# Patient Record
Sex: Male | Born: 1985
Health system: Southern US, Community
[De-identification: ages and names within clinical notes are randomized; demographics above are authoritative.]

## PROBLEM LIST (undated history)

## (undated) DIAGNOSIS — G43909 Migraine, unspecified, not intractable, without status migrainosus: Secondary | ICD-10-CM

## (undated) DIAGNOSIS — J690 Pneumonitis due to inhalation of food and vomit: Secondary | ICD-10-CM

## (undated) DIAGNOSIS — K5792 Diverticulitis of intestine, part unspecified, without perforation or abscess without bleeding: Secondary | ICD-10-CM

## (undated) HISTORY — DX: Diverticulitis of intestine, part unspecified, without perforation or abscess without bleeding: K57.92

## (undated) HISTORY — PX: COLON SURGERY: SHX602

## (undated) HISTORY — PX: WISDOM TOOTH EXTRACTION: SHX21

## (undated) HISTORY — PX: LUNG SURGERY: SHX703

---

## 2006-05-26 ENCOUNTER — Ambulatory Visit: Payer: Self-pay | Admitting: Internal Medicine

## 2006-09-08 ENCOUNTER — Ambulatory Visit: Payer: Self-pay | Admitting: Internal Medicine

## 2006-09-08 LAB — CONVERTED CEMR LAB
ALT: 25 units/L (ref 0–40)
AST: 26 units/L (ref 0–37)
Albumin: 5.2 g/dL (ref 3.5–5.2)
Alkaline Phosphatase: 71 units/L (ref 39–117)
Bilirubin, Direct: 0.2 mg/dL (ref 0.0–0.3)
Chol/HDL Ratio, serum: 4.2
Cholesterol: 154 mg/dL (ref 0–200)
HDL: 36.6 mg/dL — ABNORMAL LOW (ref >39.0)
LDL Cholesterol: 100 mg/dL — ABNORMAL HIGH (ref 0–99)
Total Bilirubin: 1.8 mg/dL — ABNORMAL HIGH (ref 0.3–1.2)
Total Protein: 7.8 g/dL (ref 6.0–8.3)
Triglyceride fasting, serum: 86 mg/dL (ref 0–149)
VLDL: 17 mg/dL (ref 0–40)

## 2019-02-15 DIAGNOSIS — K631 Perforation of intestine (nontraumatic): Secondary | ICD-10-CM

## 2019-02-15 HISTORY — DX: Perforation of intestine (nontraumatic): K63.1

## 2019-03-05 ENCOUNTER — Emergency Department (HOSPITAL_COMMUNITY): Payer: BLUE CROSS/BLUE SHIELD | Admitting: Certified Registered"

## 2019-03-05 ENCOUNTER — Inpatient Hospital Stay (HOSPITAL_BASED_OUTPATIENT_CLINIC_OR_DEPARTMENT_OTHER)
Admission: EM | Admit: 2019-03-05 | Discharge: 2019-03-13 | DRG: 329 | Disposition: A | Discharge status: Home / Self Care | Payer: BLUE CROSS/BLUE SHIELD | Attending: Emergency Medicine | Admitting: Emergency Medicine

## 2019-03-05 ENCOUNTER — Encounter (HOSPITAL_COMMUNITY): Admission: EM | Disposition: A | Discharge status: Home / Self Care | Payer: Self-pay | Source: Home / Self Care

## 2019-03-05 ENCOUNTER — Emergency Department (HOSPITAL_BASED_OUTPATIENT_CLINIC_OR_DEPARTMENT_OTHER): Payer: BLUE CROSS/BLUE SHIELD

## 2019-03-05 ENCOUNTER — Encounter (HOSPITAL_BASED_OUTPATIENT_CLINIC_OR_DEPARTMENT_OTHER): Payer: Self-pay | Admitting: Emergency Medicine

## 2019-03-05 ENCOUNTER — Other Ambulatory Visit: Payer: Self-pay

## 2019-03-05 DIAGNOSIS — K631 Perforation of intestine (nontraumatic): Secondary | ICD-10-CM | POA: Diagnosis present

## 2019-03-05 DIAGNOSIS — K651 Peritoneal abscess: Secondary | ICD-10-CM | POA: Diagnosis present

## 2019-03-05 DIAGNOSIS — E876 Hypokalemia: Secondary | ICD-10-CM | POA: Diagnosis not present

## 2019-03-05 DIAGNOSIS — R1031 Right lower quadrant pain: Secondary | ICD-10-CM | POA: Diagnosis not present

## 2019-03-05 DIAGNOSIS — R509 Fever, unspecified: Secondary | ICD-10-CM | POA: Diagnosis not present

## 2019-03-05 DIAGNOSIS — K5720 Diverticulitis of large intestine with perforation and abscess without bleeding: Secondary | ICD-10-CM | POA: Diagnosis not present

## 2019-03-05 DIAGNOSIS — K567 Ileus, unspecified: Secondary | ICD-10-CM | POA: Diagnosis not present

## 2019-03-05 DIAGNOSIS — A419 Sepsis, unspecified organism: Secondary | ICD-10-CM | POA: Diagnosis not present

## 2019-03-05 DIAGNOSIS — R109 Unspecified abdominal pain: Secondary | ICD-10-CM | POA: Diagnosis not present

## 2019-03-05 DIAGNOSIS — N179 Acute kidney failure, unspecified: Secondary | ICD-10-CM | POA: Diagnosis present

## 2019-03-05 DIAGNOSIS — K6389 Other specified diseases of intestine: Secondary | ICD-10-CM | POA: Diagnosis not present

## 2019-03-05 DIAGNOSIS — R197 Diarrhea, unspecified: Secondary | ICD-10-CM | POA: Diagnosis not present

## 2019-03-05 HISTORY — PX: LAPAROTOMY: SHX154

## 2019-03-05 LAB — CBC WITH DIFFERENTIAL/PLATELET
Abs Immature Granulocytes: 0.08 10*3/uL — ABNORMAL HIGH (ref 0.00–0.07)
Basophils Absolute: 0 10*3/uL (ref 0.0–0.1)
Basophils Relative: 0 %
Eosinophils Absolute: 0 10*3/uL (ref 0.0–0.5)
Eosinophils Relative: 0 %
HCT: 36.5 % — ABNORMAL LOW (ref 39.0–52.0)
Hemoglobin: 11.3 g/dL — ABNORMAL LOW (ref 13.0–17.0)
Immature Granulocytes: 1 %
Lymphocytes Relative: 7 %
Lymphs Abs: 1.1 10*3/uL (ref 0.7–4.0)
MCH: 26.7 pg (ref 26.0–34.0)
MCHC: 31 g/dL (ref 30.0–36.0)
MCV: 86.3 fL (ref 80.0–100.0)
Monocytes Absolute: 1 10*3/uL (ref 0.1–1.0)
Monocytes Relative: 6 %
Neutro Abs: 13.5 10*3/uL — ABNORMAL HIGH (ref 1.7–7.7)
Neutrophils Relative %: 86 %
Platelets: 481 10*3/uL — ABNORMAL HIGH (ref 150–400)
RBC: 4.23 MIL/uL (ref 4.22–5.81)
RDW: 14 % (ref 11.5–15.5)
WBC: 15.7 10*3/uL — ABNORMAL HIGH (ref 4.0–10.5)
nRBC: 0 % (ref 0.0–0.2)

## 2019-03-05 LAB — URINALYSIS, ROUTINE W REFLEX MICROSCOPIC
Bilirubin Urine: NEGATIVE
Glucose, UA: NEGATIVE mg/dL
Hgb urine dipstick: NEGATIVE
Ketones, ur: 15 mg/dL — AB
Leukocytes,Ua: NEGATIVE
Nitrite: NEGATIVE
Protein, ur: NEGATIVE mg/dL
Specific Gravity, Urine: 1.025 (ref 1.005–1.030)
pH: 8.5 — ABNORMAL HIGH (ref 5.0–8.0)

## 2019-03-05 LAB — COMPREHENSIVE METABOLIC PANEL
ALT: 10 U/L (ref 0–44)
AST: 11 U/L — ABNORMAL LOW (ref 15–41)
Albumin: 2.4 g/dL — ABNORMAL LOW (ref 3.5–5.0)
Alkaline Phosphatase: 49 U/L (ref 38–126)
Anion gap: 7 (ref 5–15)
BUN: 9 mg/dL (ref 6–20)
CO2: 15 mmol/L — ABNORMAL LOW (ref 22–32)
Calcium: 7 mg/dL — ABNORMAL LOW (ref 8.9–10.3)
Chloride: 115 mmol/L — ABNORMAL HIGH (ref 98–111)
Creatinine, Ser: 0.62 mg/dL (ref 0.61–1.24)
GFR calc Af Amer: >60 mL/min (ref >60)
GFR calc non Af Amer: >60 mL/min (ref >60)
Glucose, Bld: 90 mg/dL (ref 70–99)
Potassium: 2.9 mmol/L — ABNORMAL LOW (ref 3.5–5.1)
Sodium: 137 mmol/L (ref 135–145)
Total Bilirubin: 0.7 mg/dL (ref 0.3–1.2)
Total Protein: 5.6 g/dL — ABNORMAL LOW (ref 6.5–8.1)

## 2019-03-05 LAB — LIPASE, BLOOD: Lipase: 17 U/L (ref 11–51)

## 2019-03-05 LAB — LACTIC ACID, PLASMA
Lactic Acid, Venous: 2.3 mmol/L (ref 0.5–1.9)
Lactic Acid, Venous: 1.2 mmol/L (ref 0.5–1.9)

## 2019-03-05 SURGERY — LAPAROTOMY, EXPLORATORY
Anesthesia: General

## 2019-03-05 MED ORDER — METRONIDAZOLE IN NACL 5-0.79 MG/ML-% IV SOLN
500.0000 mg | Freq: Once | INTRAVENOUS | Status: AC
  Administered 2019-03-05: 17:00:00 500 mg via INTRAVENOUS
  Filled 2019-03-05: qty 100

## 2019-03-05 MED ORDER — OXYCODONE HCL 5 MG PO TABS: 5.0000 mg | ORAL_TABLET | Freq: Once | ORAL | Status: DC | PRN

## 2019-03-05 MED ORDER — PHENYLEPHRINE 40 MCG/ML (10ML) SYRINGE FOR IV PUSH (FOR BLOOD PRESSURE SUPPORT)
PREFILLED_SYRINGE | INTRAVENOUS | Status: AC
  Filled 2019-03-05: qty 10

## 2019-03-05 MED ORDER — PROPOFOL 10 MG/ML IV BOLUS
INTRAVENOUS | Status: DC | PRN
  Administered 2019-03-05: 160 mg via INTRAVENOUS

## 2019-03-05 MED ORDER — LACTATED RINGERS IV SOLN
INTRAVENOUS | Status: DC | PRN
  Administered 2019-03-05 (×3): via INTRAVENOUS

## 2019-03-05 MED ORDER — PANTOPRAZOLE SODIUM 40 MG IV SOLR
40.0000 mg | Freq: Every day | INTRAVENOUS | Status: DC
  Administered 2019-03-06 – 2019-03-10 (×6): 40 mg via INTRAVENOUS
  Filled 2019-03-05 (×6): qty 40

## 2019-03-05 MED ORDER — DIPHENHYDRAMINE HCL 50 MG/ML IJ SOLN: 12.5000 mg | Freq: Four times a day (QID) | INTRAMUSCULAR | Status: DC | PRN

## 2019-03-05 MED ORDER — ONDANSETRON 4 MG PO TBDP: 4.0000 mg | ORAL_TABLET | Freq: Four times a day (QID) | ORAL | Status: DC | PRN

## 2019-03-05 MED ORDER — METRONIDAZOLE IN NACL 5-0.79 MG/ML-% IV SOLN
500.0000 mg | Freq: Three times a day (TID) | INTRAVENOUS | Status: DC
  Administered 2019-03-06 – 2019-03-09 (×11): 500 mg via INTRAVENOUS
  Filled 2019-03-05 (×9): qty 100

## 2019-03-05 MED ORDER — DEXAMETHASONE SODIUM PHOSPHATE 10 MG/ML IJ SOLN
INTRAMUSCULAR | Status: DC | PRN
  Administered 2019-03-05: 10 mg via INTRAVENOUS

## 2019-03-05 MED ORDER — OXYCODONE HCL 5 MG/5ML PO SOLN: 5.0000 mg | Freq: Once | ORAL | Status: DC | PRN

## 2019-03-05 MED ORDER — LIDOCAINE 2% (20 MG/ML) 5 ML SYRINGE
INTRAMUSCULAR | Status: DC | PRN
  Administered 2019-03-05: 60 mg via INTRAVENOUS

## 2019-03-05 MED ORDER — SODIUM CHLORIDE 0.9 % IV BOLUS
1000.0000 mL | Freq: Once | INTRAVENOUS | Status: AC
  Administered 2019-03-05: 1000 mL via INTRAVENOUS

## 2019-03-05 MED ORDER — PHENYLEPHRINE HCL (PRESSORS) 10 MG/ML IV SOLN
INTRAVENOUS | Status: DC | PRN
  Administered 2019-03-05 (×3): 80 ug via INTRAVENOUS

## 2019-03-05 MED ORDER — LIDOCAINE 2% (20 MG/ML) 5 ML SYRINGE
INTRAMUSCULAR | Status: AC
  Filled 2019-03-05: qty 5

## 2019-03-05 MED ORDER — HYDROMORPHONE HCL 1 MG/ML IJ SOLN
INTRAMUSCULAR | Status: AC
  Filled 2019-03-05: qty 0.5

## 2019-03-05 MED ORDER — SODIUM CHLORIDE 0.9 % IV BOLUS
500.0000 mL | Freq: Once | INTRAVENOUS | Status: AC
  Administered 2019-03-05: 500 mL via INTRAVENOUS

## 2019-03-05 MED ORDER — ONDANSETRON HCL 4 MG/2ML IJ SOLN
INTRAMUSCULAR | Status: AC
  Filled 2019-03-05: qty 2

## 2019-03-05 MED ORDER — NALOXONE HCL 0.4 MG/ML IJ SOLN: 0.4000 mg | INTRAMUSCULAR | Status: DC | PRN

## 2019-03-05 MED ORDER — ONDANSETRON HCL 4 MG/2ML IJ SOLN: 4.0000 mg | Freq: Four times a day (QID) | INTRAMUSCULAR | Status: DC | PRN

## 2019-03-05 MED ORDER — METHOCARBAMOL 500 MG PO TABS
500.0000 mg | ORAL_TABLET | Freq: Four times a day (QID) | ORAL | Status: DC | PRN
  Administered 2019-03-06 – 2019-03-09 (×3): 500 mg via ORAL
  Filled 2019-03-05 (×3): qty 1

## 2019-03-05 MED ORDER — ONDANSETRON HCL 4 MG/2ML IJ SOLN
INTRAMUSCULAR | Status: DC | PRN
  Administered 2019-03-05: 4 mg via INTRAVENOUS

## 2019-03-05 MED ORDER — KETOROLAC TROMETHAMINE 30 MG/ML IJ SOLN
30.0000 mg | Freq: Four times a day (QID) | INTRAMUSCULAR | Status: AC
  Administered 2019-03-05 – 2019-03-10 (×20): 30 mg via INTRAVENOUS
  Filled 2019-03-05 (×16): qty 1

## 2019-03-05 MED ORDER — DEXAMETHASONE SODIUM PHOSPHATE 10 MG/ML IJ SOLN
INTRAMUSCULAR | Status: AC
  Filled 2019-03-05: qty 1

## 2019-03-05 MED ORDER — KETOROLAC TROMETHAMINE 30 MG/ML IJ SOLN
30.0000 mg | Freq: Four times a day (QID) | INTRAMUSCULAR | Status: DC | PRN
  Filled 2019-03-05: qty 1

## 2019-03-05 MED ORDER — SODIUM CHLORIDE 0.9 % IV SOLN
2.0000 g | Freq: Once | INTRAVENOUS | Status: AC
  Administered 2019-03-05: 2 g via INTRAVENOUS
  Filled 2019-03-05: qty 20

## 2019-03-05 MED ORDER — CHLORHEXIDINE GLUCONATE CLOTH 2 % EX PADS: 6.0000 | MEDICATED_PAD | Freq: Once | CUTANEOUS | Status: DC

## 2019-03-05 MED ORDER — PROPOFOL 10 MG/ML IV BOLUS
INTRAVENOUS | Status: AC
  Filled 2019-03-05: qty 20

## 2019-03-05 MED ORDER — FENTANYL CITRATE (PF) 250 MCG/5ML IJ SOLN
INTRAMUSCULAR | Status: AC
  Filled 2019-03-05: qty 5

## 2019-03-05 MED ORDER — KETOROLAC TROMETHAMINE 30 MG/ML IJ SOLN
INTRAMUSCULAR | Status: AC
  Administered 2019-03-05: 23:00:00 30 mg via INTRAVENOUS
  Filled 2019-03-05: qty 1

## 2019-03-05 MED ORDER — SODIUM CHLORIDE 0.9 % IV SOLN
2.0000 g | INTRAVENOUS | Status: DC
  Administered 2019-03-06 – 2019-03-08 (×3): 2 g via INTRAVENOUS
  Filled 2019-03-05 (×4): qty 20

## 2019-03-05 MED ORDER — ONDANSETRON HCL 4 MG/2ML IJ SOLN
4.0000 mg | Freq: Four times a day (QID) | INTRAMUSCULAR | Status: DC | PRN
  Administered 2019-03-07 – 2019-03-08 (×4): 4 mg via INTRAVENOUS
  Filled 2019-03-05 (×4): qty 2

## 2019-03-05 MED ORDER — ACETAMINOPHEN 10 MG/ML IV SOLN: 1000.0000 mg | Freq: Once | INTRAVENOUS | Status: DC | PRN

## 2019-03-05 MED ORDER — SODIUM CHLORIDE 0.9% FLUSH: 9.0000 mL | INTRAVENOUS | Status: DC | PRN

## 2019-03-05 MED ORDER — SUCCINYLCHOLINE CHLORIDE 200 MG/10ML IV SOSY
PREFILLED_SYRINGE | INTRAVENOUS | Status: AC
  Filled 2019-03-05: qty 10

## 2019-03-05 MED ORDER — SODIUM CHLORIDE 0.9 % IV SOLN
INTRAVENOUS | Status: DC | PRN
  Administered 2019-03-05: 250 mL via INTRAVENOUS

## 2019-03-05 MED ORDER — ACETAMINOPHEN 10 MG/ML IV SOLN
INTRAVENOUS | Status: AC
  Filled 2019-03-05: qty 100

## 2019-03-05 MED ORDER — SODIUM CHLORIDE 0.9 % IV SOLN: 2.0000 g | INTRAVENOUS | Status: DC

## 2019-03-05 MED ORDER — ACETAMINOPHEN 650 MG RE SUPP: 650.0000 mg | Freq: Four times a day (QID) | RECTAL | Status: DC | PRN

## 2019-03-05 MED ORDER — POTASSIUM CHLORIDE CRYS ER 20 MEQ PO TBCR
60.0000 meq | EXTENDED_RELEASE_TABLET | Freq: Once | ORAL | Status: AC
  Administered 2019-03-05: 60 meq via ORAL
  Filled 2019-03-05: qty 3

## 2019-03-05 MED ORDER — ROCURONIUM BROMIDE 50 MG/5ML IV SOSY
PREFILLED_SYRINGE | INTRAVENOUS | Status: DC | PRN
  Administered 2019-03-05: 30 mg via INTRAVENOUS
  Administered 2019-03-05: 50 mg via INTRAVENOUS

## 2019-03-05 MED ORDER — FENTANYL CITRATE (PF) 100 MCG/2ML IJ SOLN
50.0000 ug | INTRAMUSCULAR | Status: DC | PRN
  Administered 2019-03-06 (×2): 50 ug via INTRAVENOUS
  Filled 2019-03-05 (×3): qty 2

## 2019-03-05 MED ORDER — HYDRALAZINE HCL 20 MG/ML IJ SOLN: 10.0000 mg | INTRAMUSCULAR | Status: DC | PRN

## 2019-03-05 MED ORDER — LIDOCAINE HCL (CARDIAC) PF 100 MG/5ML IV SOSY: PREFILLED_SYRINGE | INTRAVENOUS | Status: DC | PRN

## 2019-03-05 MED ORDER — SUCCINYLCHOLINE CHLORIDE 20 MG/ML IJ SOLN
INTRAMUSCULAR | Status: DC | PRN
  Administered 2019-03-05: 70 mg via INTRAVENOUS

## 2019-03-05 MED ORDER — ACETAMINOPHEN 325 MG PO TABS
650.0000 mg | ORAL_TABLET | Freq: Once | ORAL | Status: AC
  Administered 2019-03-05: 17:00:00 650 mg via ORAL
  Filled 2019-03-05: qty 2

## 2019-03-05 MED ORDER — ROCURONIUM BROMIDE 50 MG/5ML IV SOSY
PREFILLED_SYRINGE | INTRAVENOUS | Status: AC
  Filled 2019-03-05: qty 10

## 2019-03-05 MED ORDER — FENTANYL 40 MCG/ML IV SOLN: INTRAVENOUS | Status: DC

## 2019-03-05 MED ORDER — SUGAMMADEX SODIUM 200 MG/2ML IV SOLN
INTRAVENOUS | Status: DC | PRN
  Administered 2019-03-05: 200 mg via INTRAVENOUS

## 2019-03-05 MED ORDER — ACETAMINOPHEN 10 MG/ML IV SOLN
INTRAVENOUS | Status: DC | PRN
  Administered 2019-03-05: 1000 mg via INTRAVENOUS

## 2019-03-05 MED ORDER — IOHEXOL 300 MG/ML  SOLN
100.0000 mL | Freq: Once | INTRAMUSCULAR | Status: AC | PRN
  Administered 2019-03-05: 100 mL via INTRAVENOUS

## 2019-03-05 MED ORDER — DIPHENHYDRAMINE HCL 12.5 MG/5ML PO ELIX: 12.5000 mg | ORAL_SOLUTION | Freq: Four times a day (QID) | ORAL | Status: DC | PRN

## 2019-03-05 MED ORDER — HYDROMORPHONE HCL 1 MG/ML IJ SOLN
INTRAMUSCULAR | Status: DC | PRN
  Administered 2019-03-05 (×2): .5 mg via INTRAVENOUS

## 2019-03-05 MED ORDER — 0.9 % SODIUM CHLORIDE (POUR BTL) OPTIME
TOPICAL | Status: DC | PRN
  Administered 2019-03-05 (×4): 1000 mL

## 2019-03-05 MED ORDER — SODIUM CHLORIDE 0.9 % IV SOLN
INTRAVENOUS | Status: DC | PRN
  Administered 2019-03-05: 17:00:00 250 mL via INTRAVENOUS

## 2019-03-05 MED ORDER — METRONIDAZOLE IN NACL 5-0.79 MG/ML-% IV SOLN: 500.0000 mg | Freq: Three times a day (TID) | INTRAVENOUS | Status: DC

## 2019-03-05 MED ORDER — MIDAZOLAM HCL 2 MG/2ML IJ SOLN
INTRAMUSCULAR | Status: AC
  Filled 2019-03-05: qty 2

## 2019-03-05 MED ORDER — DEXTROSE-NACL 5-0.9 % IV SOLN
INTRAVENOUS | Status: DC
  Administered 2019-03-05 – 2019-03-12 (×11): via INTRAVENOUS

## 2019-03-05 MED ORDER — ENOXAPARIN SODIUM 40 MG/0.4ML ~~LOC~~ SOLN
40.0000 mg | SUBCUTANEOUS | Status: DC
  Administered 2019-03-06 – 2019-03-13 (×8): 40 mg via SUBCUTANEOUS
  Filled 2019-03-05 (×8): qty 0.4

## 2019-03-05 MED ORDER — ENOXAPARIN SODIUM 40 MG/0.4ML ~~LOC~~ SOLN: 40.0000 mg | SUBCUTANEOUS | Status: DC

## 2019-03-05 MED ORDER — ACETAMINOPHEN 160 MG/5ML PO SOLN: 1000.0000 mg | Freq: Once | ORAL | Status: DC | PRN

## 2019-03-05 MED ORDER — FENTANYL CITRATE (PF) 100 MCG/2ML IJ SOLN
INTRAMUSCULAR | Status: DC | PRN
  Administered 2019-03-05 (×2): 50 ug via INTRAVENOUS
  Administered 2019-03-05: 150 ug via INTRAVENOUS

## 2019-03-05 MED ORDER — ACETAMINOPHEN 325 MG PO TABS
650.0000 mg | ORAL_TABLET | Freq: Four times a day (QID) | ORAL | Status: DC | PRN
  Administered 2019-03-06: 650 mg via ORAL
  Filled 2019-03-05: qty 2

## 2019-03-05 MED ORDER — FENTANYL CITRATE (PF) 100 MCG/2ML IJ SOLN
25.0000 ug | INTRAMUSCULAR | Status: DC | PRN
  Administered 2019-03-05 (×2): 50 ug via INTRAVENOUS

## 2019-03-05 MED ORDER — MIDAZOLAM HCL 5 MG/5ML IJ SOLN
INTRAMUSCULAR | Status: DC | PRN
  Administered 2019-03-05: 2 mg via INTRAVENOUS

## 2019-03-05 MED ORDER — ACETAMINOPHEN 500 MG PO TABS: 1000.0000 mg | ORAL_TABLET | Freq: Once | ORAL | Status: DC | PRN

## 2019-03-05 MED ORDER — FENTANYL CITRATE (PF) 100 MCG/2ML IJ SOLN
INTRAMUSCULAR | Status: AC
  Administered 2019-03-05: 23:00:00 50 ug via INTRAVENOUS
  Filled 2019-03-05: qty 2

## 2019-03-05 SURGICAL SUPPLY — 58 items
BLADE CLIPPER SURG (BLADE) IMPLANT
BNDG GAUZE ELAST 4 BULKY (GAUZE/BANDAGES/DRESSINGS) ×3 IMPLANT
CANISTER SUCT 3000ML PPV (MISCELLANEOUS) ×3 IMPLANT
CATH FOLEY 2WAY SLVR  5CC 16FR (CATHETERS) ×2
CATH FOLEY 2WAY SLVR 5CC 16FR (CATHETERS) ×1 IMPLANT
CHLORAPREP W/TINT 26ML (MISCELLANEOUS) ×3 IMPLANT
COVER SURGICAL LIGHT HANDLE (MISCELLANEOUS) ×3 IMPLANT
COVER WAND RF STERILE (DRAPES) ×3 IMPLANT
DRAIN CHANNEL 19F RND (DRAIN) ×3 IMPLANT
DRAPE LAPAROSCOPIC ABDOMINAL (DRAPES) ×3 IMPLANT
DRAPE WARM FLUID 44X44 (DRAPE) ×3 IMPLANT
DRSG OPSITE POSTOP 4X10 (GAUZE/BANDAGES/DRESSINGS) IMPLANT
DRSG OPSITE POSTOP 4X8 (GAUZE/BANDAGES/DRESSINGS) IMPLANT
DRSG PAD ABDOMINAL 8X10 ST (GAUZE/BANDAGES/DRESSINGS) ×3 IMPLANT
ELECT BLADE 6.5 EXT (BLADE) IMPLANT
ELECT CAUTERY BLADE 6.4 (BLADE) ×3 IMPLANT
ELECT REM PT RETURN 9FT ADLT (ELECTROSURGICAL) ×3
ELECTRODE REM PT RTRN 9FT ADLT (ELECTROSURGICAL) ×1 IMPLANT
GAUZE SPONGE 4X4 12PLY STRL (GAUZE/BANDAGES/DRESSINGS) ×3 IMPLANT
GLOVE BIO SURGEON STRL SZ 6.5 (GLOVE) ×2 IMPLANT
GLOVE BIO SURGEON STRL SZ8 (GLOVE) ×3 IMPLANT
GLOVE BIO SURGEONS STRL SZ 6.5 (GLOVE) ×1
GLOVE BIOGEL PI IND STRL 7.0 (GLOVE) ×2 IMPLANT
GLOVE BIOGEL PI IND STRL 8 (GLOVE) ×1 IMPLANT
GLOVE BIOGEL PI INDICATOR 7.0 (GLOVE) ×4
GLOVE BIOGEL PI INDICATOR 8 (GLOVE) ×2
GLOVE SURG SS PI 7.0 STRL IVOR (GLOVE) ×6 IMPLANT
GOWN STRL REUS W/ TWL LRG LVL3 (GOWN DISPOSABLE) ×1 IMPLANT
GOWN STRL REUS W/ TWL XL LVL3 (GOWN DISPOSABLE) ×1 IMPLANT
GOWN STRL REUS W/TWL LRG LVL3 (GOWN DISPOSABLE) ×2
GOWN STRL REUS W/TWL XL LVL3 (GOWN DISPOSABLE) ×2
KIT BASIN OR (CUSTOM PROCEDURE TRAY) ×3 IMPLANT
KIT OSTOMY DRAINABLE 2.75 STR (WOUND CARE) ×3 IMPLANT
KIT TURNOVER KIT B (KITS) ×3 IMPLANT
LIGASURE IMPACT 36 18CM CVD LR (INSTRUMENTS) ×3 IMPLANT
NS IRRIG 1000ML POUR BTL (IV SOLUTION) ×6 IMPLANT
PACK GENERAL/GYN (CUSTOM PROCEDURE TRAY) ×3 IMPLANT
PAD ARMBOARD 7.5X6 YLW CONV (MISCELLANEOUS) ×3 IMPLANT
PENCIL SMOKE EVACUATOR (MISCELLANEOUS) ×3 IMPLANT
PUNCH CORNEAL DONOR 7.0MM (MISCELLANEOUS) ×3 IMPLANT
RELOAD PROXIMATE 75MM BLUE (ENDOMECHANICALS) ×6 IMPLANT
SPECIMEN JAR LARGE (MISCELLANEOUS) IMPLANT
SPONGE LAP 18X18 RF (DISPOSABLE) ×3 IMPLANT
STAPLER PROXIMATE 75MM BLUE (STAPLE) ×3 IMPLANT
STAPLER VISISTAT 35W (STAPLE) ×3 IMPLANT
SUCTION POOLE TIP (SUCTIONS) ×3 IMPLANT
SUT ETHILON 2 0 FS 18 (SUTURE) ×3 IMPLANT
SUT PDS AB 1 TP1 96 (SUTURE) ×6 IMPLANT
SUT PROLENE 2 0 CT2 30 (SUTURE) ×3 IMPLANT
SUT VIC AB 2-0 SH 18 (SUTURE) ×6 IMPLANT
SUT VIC AB 3-0 SH 18 (SUTURE) ×3 IMPLANT
SUT VIC AB 3-0 SH 8-18 (SUTURE) ×3 IMPLANT
SUT VICRYL AB 2 0 TIES (SUTURE) ×3 IMPLANT
SUT VICRYL AB 3 0 TIES (SUTURE) ×3 IMPLANT
TOWEL OR 17X24 6PK STRL BLUE (TOWEL DISPOSABLE) ×3 IMPLANT
TOWEL OR 17X26 10 PK STRL BLUE (TOWEL DISPOSABLE) ×3 IMPLANT
TRAY FOLEY MTR SLVR 16FR STAT (SET/KITS/TRAYS/PACK) ×3 IMPLANT
YANKAUER SUCT BULB TIP NO VENT (SUCTIONS) IMPLANT

## 2019-03-05 NOTE — ED Notes (Signed)
Patient transported to CT 

## 2019-03-05 NOTE — ED Notes (Signed)
ED Provider at bedside. 

## 2019-03-05 NOTE — H&P (Signed)
Kyle EhlersBryceton Moss is an 33 y.o. male.   Chief Complaint: Abdominal pain x2 weeks HPI: Patient transferred from the [facility] emergency room secondary to abdominal pain.  He presents to the emergency room this morning with progressive lower abdominal pain.  The pain service 2 weeks ago.  Is been associated with diarrhea.  There is been no blood in his stool.  He has no medical problems and takes no chronic medications.  CT scan showed perforation of his distal descending colon or sigmoid colon.  There is free air of through the umbilicus.  He was extremely tender in his lower abdomen.  He also had an elevated white count of 15,000 and a fever to 101.  He denies any recent history of cough, shortness of breath or other complicating issues.  History reviewed. No pertinent past medical history.  History reviewed. No pertinent surgical history.  History reviewed. No pertinent family history. Social History:  reports that he has never smoked. He has never used smokeless tobacco. He reports that he does not drink alcohol or use drugs.  Allergies: No Known Allergies  (Not in a hospital admission)   Results for orders placed or performed during the hospital encounter of 03/05/19 (from the past 48 hour(s))  CBC with Differential     Status: Abnormal   Collection Time: 03/05/19  2:31 PM  Result Value Ref Range   WBC 15.7 (H) 4.0 - 10.5 K/uL   RBC 4.23 4.22 - 5.81 MIL/uL   Hemoglobin 11.3 (L) 13.0 - 17.0 g/dL   HCT 16.136.5 (L) 09.639.0 - 04.552.0 %   MCV 86.3 80.0 - 100.0 fL   MCH 26.7 26.0 - 34.0 pg   MCHC 31.0 30.0 - 36.0 g/dL   RDW 40.914.0 81.111.5 - 91.415.5 %   Platelets 481 (H) 150 - 400 K/uL   nRBC 0.0 0.0 - 0.2 %   Neutrophils Relative % 86 %   Neutro Abs 13.5 (H) 1.7 - 7.7 K/uL   Lymphocytes Relative 7 %   Lymphs Abs 1.1 0.7 - 4.0 K/uL   Monocytes Relative 6 %   Monocytes Absolute 1.0 0.1 - 1.0 K/uL   Eosinophils Relative 0 %   Eosinophils Absolute 0.0 0.0 - 0.5 K/uL   Basophils Relative 0 %    Basophils Absolute 0.0 0.0 - 0.1 K/uL   Immature Granulocytes 1 %   Abs Immature Granulocytes 0.08 (H) 0.00 - 0.07 K/uL    Comment: Performed at [facility], [address] [place], [place] [id number]  Comprehensive metabolic panel     Status: Abnormal   Collection Time: 03/05/19  2:31 PM  Result Value Ref Range   Sodium 137 135 - 145 mmol/L   Potassium 2.9 (L) 3.5 - 5.1 mmol/L   Chloride 115 (H) 98 - 111 mmol/L   CO2 15 (L) 22 - 32 mmol/L   Glucose, Bld 90 70 - 99 mg/dL   BUN 9 6 - 20 mg/dL   Creatinine, Ser 6.210.62 0.61 - 1.24 mg/dL   Calcium 7.0 (L) 8.9 - 10.3 mg/dL   Total Protein 5.6 (L) 6.5 - 8.1 g/dL   Albumin 2.4 (L) 3.5 - 5.0 g/dL   AST 11 (L) 15 - 41 U/L   ALT 10 0 - 44 U/L   Alkaline Phosphatase 49 38 - 126 U/L   Total Bilirubin 0.7 0.3 - 1.2 mg/dL   GFR calc non Af Amer >60 >60 mL/min   GFR calc Af Amer >60 >60  mL/min   Anion gap 7 5 - 15    Comment: Performed at [facility], [address] [place] [address] [place], [place] [address]  Lipase, blood     Status: None   Collection Time: 03/05/19  2:31 PM  Result Value Ref Range   Lipase 17 11 - 51 U/L    Comment: Performed at [facility], [address] [place], [place] [address]  Urinalysis, Routine w reflex microscopic     Status: Abnormal   Collection Time: 03/05/19  2:31 PM  Result Value Ref Range   Color, Urine YELLOW YELLOW   APPearance CLEAR CLEAR   Specific Gravity, Urine 1.025 1.005 - 1.030   pH 8.5 (H) 5.0 - 8.0   Glucose, UA NEGATIVE NEGATIVE mg/dL   Hgb urine dipstick NEGATIVE NEGATIVE   Bilirubin Urine NEGATIVE NEGATIVE   Ketones, ur 15 (A) NEGATIVE mg/dL   Protein, ur NEGATIVE NEGATIVE mg/dL   Nitrite NEGATIVE NEGATIVE   Leukocytes,Ua NEGATIVE NEGATIVE    Comment: Microscopic not done on urines with negative protein, blood, leukocytes, nitrite, or glucose < 500 mg/dL. Performed at [facility], [address] [place], [place] [address]   Lactic acid, plasma      Status: Abnormal   Collection Time: 03/05/19  2:31 PM  Result Value Ref Range   Lactic Acid, Venous 2.3 (HH) 0.5 - 1.9 mmol/L    Comment: CRITICAL RESULT CALLED TO, READ BACK BY AND VERIFIED WITHBryna Colander RN AT 1537 03/05/19 BY [facility] Performed at [facility], [address] [place], [place] [address]   Lactic acid, plasma     Status: None   Collection Time: 03/05/19  4:53 PM  Result Value Ref Range   Lactic Acid, Venous 1.2 0.5 - 1.9 mmol/L    Comment: Performed at [facility], [address] [place], [place] [address]   Ct Abdomen Pelvis W Contrast  Result Date: 03/05/2019 CLINICAL DATA:  Abdominal pain EXAM: CT ABDOMEN AND PELVIS WITH CONTRAST TECHNIQUE: Multidetector CT imaging of the abdomen and pelvis was performed using the standard protocol following bolus administration of intravenous contrast. CONTRAST:  OMNIPAQUE IOHEXOL 300 MG/ML  SOLN COMPARISON:  None. FINDINGS: Lower chest: No acute abnormality. Hepatobiliary: No focal liver abnormality is seen. No gallstones, gallbladder wall thickening, or biliary dilatation. Pancreas: Unremarkable. No pancreatic ductal dilatation or surrounding inflammatory changes. Spleen: Normal in size without focal abnormality. Adrenals/Urinary Tract: Normal adrenal glands. Two hypodense, fluid attenuating left renal masses measuring 16 mm and 12 mm respectively consistent with cysts. No urolithiasis or obstructive uropathy. Generalized bladder wall thickening likely reactive secondary to adjacent inflammation from the contained perforation of sigmoid colon. Stomach/Bowel: Normal stomach. Normal appendix. Bowel wall thickening and surrounding inflammatory changes of the descending colon and sigmoid colon. 8.8 x 8 x 14 cm thick walled cavitary area along the sigmoid colon with an area of communication with the sigmoid colon on image 32/series 5 and appearing to decompress into the umbilicus. Cavitary area has a moderate amount  of air and stool material within it. Small rim enhancing area along the mesenteric aspect of the sigmoid colon on image 53/series 2 may reflect a diverticulum versus small mural abscess with similar areas seen slightly more proximally on image 55 and 56. Vascular/Lymphatic: No significant vascular findings are present. No enlarged abdominal or pelvic lymph nodes. Reproductive: Prostate is unremarkable. Other: No abdominal wall hernia or abnormality. No abdominopelvic ascites.  Musculoskeletal: No acute or significant osseous findings. IMPRESSION: 1. Bowel wall thickening and surrounding inflammatory changes of the descending colon and sigmoid colon. 8.8 x 8 x 14 cm contained perforation arising from the sigmoid colon which continues to communicate with the sigmoid colon best seen on image 32/series 5 and decompressing into the umbilicus. Small rim enhancing areas are also seen along the wall of the sigmoid colon on image 53, 55 and 56 of series 2 which may reflect small diverticula versus mural abscesses. Differential considerations for overall process include an inflammatory etiology such as Crohn's disease versus diverticulitis which is considered less likely as there are no other diverticula visualized elsewhere and given the patient's age. Electronically Signed   By: Elige Ko   On: 03/05/2019 16:57    Review of Systems  Constitutional: Negative for chills and fever.  Gastrointestinal: Positive for abdominal pain and diarrhea. Negative for blood in stool, constipation, nausea and vomiting.  Skin: Negative for rash.  All other systems reviewed and are negative.   Blood pressure 125/80, pulse (!) 105, temperature 98.2 F (36.8 C), temperature source Oral, resp. rate 18, height  (1.778 m), weight 64.9 kg, SpO2 100 %. Physical Exam  Constitutional: He is oriented to person, place, and time. He appears well-developed and well-nourished.  HENT:  Head: Normocephalic and atraumatic.  Eyes: Pupils  are equal, round, and reactive to light.  Neck: Normal range of motion. Neck supple.  Cardiovascular: Normal rate and regular rhythm.  Respiratory: Effort normal and breath sounds normal.  GI: There is abdominal tenderness in the right lower quadrant, suprapubic area and left lower quadrant. There is rebound and guarding. There is no rigidity.  Musculoskeletal: Normal range of motion.  Neurological: He is alert and oriented to person, place, and time.  Skin: Skin is warm and dry.     Assessment/Plan Colonic perforation with peritonitis  Unusual appearing CT scan.  This could be from Crohn's disease but he has no history of chronic abdominal pain the diarrhea is just recent.  Less likely diverticulitis but this is is a possibility.  This could be a sigmoid volvulus as well.  Given the amount of free air, inflammation, elevated white count, and fever I recommend exploratory laparotomy with resection.  He may require colostomy which she is aware of.  We discussed potential complications of surgery to include but not exclusive of bleeding, infection, organ injury, bowel injury, the need for multiple procedures, blood clot, stroke, death, colostomy, the future need for further procedures, and the need further treatments and/or procedures.  The other option would be medical management but given his fever and his tachycardia I do not feel this would be best at this point time after reviewing his CT scan and examining the patient.  Dortha Schwalbe, MD 03/05/2019, 7:37 PM

## 2019-03-05 NOTE — Transfer of Care (Signed)
Immediate Anesthesia Transfer of Care Note  Patient: Kyle Moss  Procedure(s) Performed: EXPLORATORY LAPAROTOMY (N/A )  Patient Location: PACU  Anesthesia Type:General  Level of Consciousness: awake, alert  and oriented  Airway & Oxygen Therapy: Patient Spontanous Breathing  Post-op Assessment: Report given to RN and Post -op Vital signs reviewed and stable  Post vital signs: Reviewed and stable  Last Vitals:  Vitals Value Taken Time  BP 131/85 03/05/2019 10:17 PM  Temp    Pulse 98 03/05/2019 10:19 PM  Resp 12 03/05/2019 10:19 PM  SpO2 100 % 03/05/2019 10:19 PM  Vitals shown include unvalidated device data.  Last Pain:  Vitals:   03/05/19 1933  TempSrc: Oral  PainSc:          Complications: No apparent anesthesia complications

## 2019-03-05 NOTE — ED Provider Notes (Signed)
[facility] EMERGENCY DEPARTMENT Provider Note   CSN: [id number] Arrival date & time: 03/05/19  1349    History   Chief Complaint Chief Complaint  Patient presents with   Abdominal Pain    HPI Kyle Moss is a 33 y.o. male.     33 year old male presents with complaint of abdominal discomfort and loose stools x2 weeks.  Patient reports periumbilical abdominal discomfort, loose stools anytime he eats anything with discomfort described as pressure on his lower bowels.  Patient denies nausea or vomiting, went to urgent care today for evaluation and was found to have a temperature of 101.4, was 100.2 on recheck and was sent to the ER for further evaluation.  Upon arrival in triage patient has a temperature of 99.9 oral with a pulse of 123.  Patient does unaware of his fever prior to evaluation with urgent care and has not had any antipyretics.  Patient denies any blood in his stool, recent travel or sick contacts.  Patient states that he had a respiratory illness in February which caused him to lose his appetite, states since that time he has lost about 20 pounds over 2 months, attributes this to his lack of appetite.  Patient denies any medical history including any history of IBS or Crohn's disease, reports possible history of IBS in the family.  No prior abdominal surgeries.  No other complaints or concerns.     History reviewed. No pertinent past medical history.  There are no active problems to display for this patient.   History reviewed. No pertinent surgical history.      Home Medications    Prior to Admission medications   Not on File    Family History History reviewed. No pertinent family history.  Social History Social History   Tobacco Use   Smoking status: Never Smoker   Smokeless tobacco: Never Used  Substance Use Topics   Alcohol use: Never    Frequency: Never   Drug use: Never     Allergies   Patient has no known  allergies.   Review of Systems Review of Systems  Constitutional: Positive for appetite change, fever and unexpected weight change. Negative for chills and diaphoresis.  Gastrointestinal: Positive for abdominal pain and diarrhea. Negative for anal bleeding, blood in stool, constipation, nausea and vomiting.  Genitourinary: Negative for dysuria, frequency and urgency.  Skin: Negative for rash and wound.  Allergic/Immunologic: Negative for immunocompromised state.  Neurological: Negative for dizziness and weakness.  Hematological: Negative for adenopathy.  All other systems reviewed and are negative.    Physical Exam Updated Vital Signs BP 108/64    Pulse (!) 112    Temp 98.6 F (37 C) (Oral)    Resp 20    Ht  (1.778 m)    Wt 64.9 kg    SpO2 97%    BMI 20.52 kg/m   Physical Exam Vitals signs and nursing note reviewed.  Constitutional:      General: He is not in acute distress.    Appearance: He is well-developed. He is not ill-appearing or diaphoretic.  HENT:     Head: Normocephalic and atraumatic.  Cardiovascular:     Rate and Rhythm: Regular rhythm. Tachycardia present.     Heart sounds: No murmur.  Pulmonary:     Effort: Pulmonary effort is normal.     Breath sounds: Normal breath sounds.  Abdominal:     General: Bowel sounds are normal.     Tenderness: There is generalized abdominal  tenderness. There is guarding. Small umbilical hernia with mild erythema.  Skin:    General: Skin is warm and dry.  Neurological:     Mental Status: He is alert and oriented to person, place, and time.  Psychiatric:        Behavior: Behavior normal.      ED Treatments / Results  Labs (all labs ordered are listed, but only abnormal results are displayed) Labs Reviewed  CBC WITH DIFFERENTIAL/PLATELET - Abnormal; Notable for the following components:      Result Value   WBC 15.7 (*)    Hemoglobin 11.3 (*)    HCT 36.5 (*)    Platelets 481 (*)    Neutro Abs 13.5 (*)    Abs  Immature Granulocytes 0.08 (*)    All other components within normal limits  COMPREHENSIVE METABOLIC PANEL - Abnormal; Notable for the following components:   Potassium 2.9 (*)    Chloride 115 (*)    CO2 15 (*)    Calcium 7.0 (*)    Total Protein 5.6 (*)    Albumin 2.4 (*)    AST 11 (*)    All other components within normal limits  URINALYSIS, ROUTINE W REFLEX MICROSCOPIC - Abnormal; Notable for the following components:   pH 8.5 (*)    Ketones, ur 15 (*)    All other components within normal limits  LACTIC ACID, PLASMA - Abnormal; Notable for the following components:   Lactic Acid, Venous 2.3 (*)    All other components within normal limits  CULTURE, BLOOD (ROUTINE X 2)  CULTURE, BLOOD (ROUTINE X 2)  LIPASE, BLOOD  LACTIC ACID, PLASMA    EKG None  Radiology Ct Abdomen Pelvis W Contrast  Result Date: 03/05/2019 CLINICAL DATA:  Abdominal pain EXAM: CT ABDOMEN AND PELVIS WITH CONTRAST TECHNIQUE: Multidetector CT imaging of the abdomen and pelvis was performed using the standard protocol following bolus administration of intravenous contrast. CONTRAST:  OMNIPAQUE IOHEXOL 300 MG/ML  SOLN COMPARISON:  None. FINDINGS: Lower chest: No acute abnormality. Hepatobiliary: No focal liver abnormality is seen. No gallstones, gallbladder wall thickening, or biliary dilatation. Pancreas: Unremarkable. No pancreatic ductal dilatation or surrounding inflammatory changes. Spleen: Normal in size without focal abnormality. Adrenals/Urinary Tract: Normal adrenal glands. Two hypodense, fluid attenuating left renal masses measuring 16 mm and 12 mm respectively consistent with cysts. No urolithiasis or obstructive uropathy. Generalized bladder wall thickening likely reactive secondary to adjacent inflammation from the contained perforation of sigmoid colon. Stomach/Bowel: Normal stomach. Normal appendix. Bowel wall thickening and surrounding inflammatory changes of the descending colon and sigmoid colon.  8.8 x 8 x 14 cm thick walled cavitary area along the sigmoid colon with an area of communication with the sigmoid colon on image 32/series 5 and appearing to decompress into the umbilicus. Cavitary area has a moderate amount of air and stool material within it. Small rim enhancing area along the mesenteric aspect of the sigmoid colon on image 53/series 2 may reflect a diverticulum versus small mural abscess with similar areas seen slightly more proximally on image 55 and 56. Vascular/Lymphatic: No significant vascular findings are present. No enlarged abdominal or pelvic lymph nodes. Reproductive: Prostate is unremarkable. Other: No abdominal wall hernia or abnormality. No abdominopelvic ascites. Musculoskeletal: No acute or significant osseous findings. IMPRESSION: 1. Bowel wall thickening and surrounding inflammatory changes of the descending colon and sigmoid colon. 8.8 x 8 x 14 cm contained perforation arising from the sigmoid colon which continues to communicate with the sigmoid  colon best seen on image 32/series 5 and decompressing into the umbilicus. Small rim enhancing areas are also seen along the wall of the sigmoid colon on image 53, 55 and 56 of series 2 which may reflect small diverticula versus mural abscesses. Differential considerations for overall process include an inflammatory etiology such as Crohn's disease versus diverticulitis which is considered less likely as there are no other diverticula visualized elsewhere and given the patient's age. Electronically Signed   By: Elige KoHetal  Patel   On: 03/05/2019 16:57    Procedures Procedures (including critical care time)  Medications Ordered in ED Medications  cefTRIAXone (ROCEPHIN) 2 g in sodium chloride 0.9 % 100 mL IVPB ( Intravenous Stopped 03/05/19 1728)    And  metroNIDAZOLE (FLAGYL) IVPB 500 mg (500 mg Intravenous New Bag/Given 03/05/19 1717)  0.9 %  sodium chloride infusion ( Intravenous Stopped 03/05/19 1749)  0.9 %  sodium chloride  infusion (250 mLs Intravenous New Bag/Given 03/05/19 1716)  sodium chloride 0.9 % bolus 1,000 mL (0 mLs Intravenous Stopped 03/05/19 1546)  acetaminophen (TYLENOL) tablet 650 mg (650 mg Oral Given 03/05/19 1636)  potassium chloride SA (K-DUR) CR tablet 60 mEq (60 mEq Oral Given 03/05/19 1637)  sodium chloride 0.9 % bolus 1,000 mL ( Intravenous Stopped 03/05/19 1749)  iohexol (OMNIPAQUE) 300 MG/ML solution 100 mL (100 mLs Intravenous Contrast Given 03/05/19 1626)     Initial Impression / Assessment and Plan / ED Course  I have reviewed the triage vital signs and the nursing notes.  Pertinent labs & imaging results that were available during my care of the patient were reviewed by me and considered in my medical decision making (see chart for details).  Clinical Course as of Mar 05 1751  Sun Mar 05, 2019  [id number] 33 year old male presents with complaint of 2 weeks of periumbilical discomfort with occasional loose stools.  Patient was seen in urgent care today and sent to the ER when he was found to be febrile with a temperature of 101, patient was unaware he had a fever and has not taken antipyretics.  Patient arrived to the emergency room afebrile however through his course in the ER did develop a temperature of 101.1 and was given Tylenol.  Patient has remained tachycardic throughout his ER stay, blood pressure with slight decrease from 136/84 to currently 108/64.  Patient was given 2 L IV fluids as well as Rocephin and Flagyl for suspected intra-abdominal infection.  CBC with leukocytosis with white count of 15.7, urinalysis with ketones, CMP with hypokalemia with potassium of 2.9, orally repleted, bicarbonate 15.  Initial lactic acid was elevated at 2.3, repeat lactic acid of 1.2. Patient was evaluated early on by Dr. Erma HeritageIsaacs, ER attending.  At change of shift patient was evaluated by Dr. Silverio LayYao who performed bedside ultrasound. CT report returns with concern for perforation of the sigmoid colon.  Consult  with Dr. Derrell Lollingamirez with general surgery, request patient be transferred to [facility], ER for surgery to evaluate patient and develop plan.  Case discussed with Dr. Hyacinth MeekerMiller, ER attending at [facility] emergency room who is aware of patient's pending arrival.   [LM]  1737 Discussed results and plan of care with patient, patient does not need any pain medication at this time, verbalizes understanding of plan of care.  Repeat abdominal exam remains unchanged, no specific tenderness/report of pain however patient is guarding.   [LM]    Clinical Course User Index [LM] Jeannie FendMurphy, Anayla Giannetti A, PA-C    .Critical Care  Performed by: Jeannie Fend, PA-C Authorized by: Jeannie Fend, PA-C   Critical care provider statement:    Critical care time (minutes):  35   Critical care was necessary to treat or prevent imminent or life-threatening deterioration of the following conditions:  Metabolic crisis and sepsis   Critical care was time spent personally by me on the following activities:  Development of treatment plan with patient or surrogate, discussions with consultants, evaluation of patient's response to treatment, examination of patient, obtaining history from patient or surrogate, ordering and performing treatments and interventions, ordering and review of laboratory studies, ordering and review of radiographic studies and re-evaluation of patient's condition       Final Clinical Impressions(s) / ED Diagnoses   Final diagnoses:  Perforated bowel (HCC)  Hypokalemia  Fever in adult  Sepsis without acute organ dysfunction, due to unspecified organism [facility])    ED Discharge Orders    None       Jeannie Fend, PA-C 03/05/19 1753    Shaune Pollack, MD 03/05/19 Ernestina [place]

## 2019-03-05 NOTE — Op Note (Signed)
Preoperative diagnosis: Colonic perforation with large intra-abdominal abscess  Postoperative diagnosis: Same  Procedure: Exporter laparotomy with resection of distal descending colon and drainage of large intra-abdominal abscess with end colostomy  Surgeon: Erroll Luna, MD  Anesthesia: General  EBL: 400 cc  Specimen: Distal descending colon proximal sigmoid colon to pathology  Drains: 19 round drain the pelvic abscess  IV fluids: Per anesthesia record  Indications for procedure: The patient is a otherwise healthy 33 year old male with a 2-week history of progressive lower abdominal pain.  He was seen at the [facility] emergency room was found by CT scan of a large intra-abdominal abscess with concerns for colonic perforation and free air that tracked up to his umbilicus.  He had a fever, tachycardia and elevated white count.  He is transferred to [facility].  He was seen in consultation and felt he required laparotomy emergently for this problem due to his abdominal examination and his findings.  I discussed this with him as well as possible etiologies which could include Crohn's disease or diverticulitis.  He understood the above and understood the need for a colostomy.The procedure was discussed with the patient.   partial colectomy discussed with the patient as well as non operative treatments. The risks of operative management include bleeding,  Infection,  Leak of anastamosis,  Ostomy formation, open procedure,  Sepsis,  Abcess,  Hernia,  DVT,  Pulmonary complications,  Cardiovascular  complications,  Injury to ureter,  Bladder,kidney,and anesthesia risks,  And death. The patient understands.  Questions answered.   The success of the procedure is 50-85 % for treating the patients symptoms. They agree to proceed.  Description of procedure: The patient was met in the holding area and any further questions were answered to best my ability.  He was taken back to the operative room.   He is placed supine upon the operating table.  General anesthesia was initiated without problem.  Abdomen was prepped and draped in a sterile fashion and a Foley catheter was placed under sterile conditions.  Timeout was observed.  Preoperative antibiotics were administered in standard fashion.  A midline incision was used from just above the umbilicus down to the pubic symphysis.  Dissection was carried down into the midline fascia was identified and opened.  We entered the abdominal cavity and there is a large abscess adjacent to the lower abdominal wall.  There is a large amount of feculent material and foul-smelling material.  This was suctioned out.  I was then able to extend the midline incision but there is significant dense inflammatory change in the pelvis.  Retractor was placed.  I found the distal descending colon to follow this and this large phlegmon/abscess.  I found the distal sigmoid colon distal to the inflammatory process.  This appeared to be a segment of Crohn's disease had a perforated.  I was able to divide the distal descending colon and distal sigmoid colon respectively to isolate the abnormal loop of distal descending colon proximal sigmoid colon.  This communicated with the abscess.  The bladder was identified it was below all this.  The abscess extended to the dome of the bladder.  This part of the bowel was resected using 2 firings of the GIA 75 stapler.  I then debrided as much of this large pelvic abscesses I could without injuring the bladder or other underlying structures.  There appeared to be no injury to the bladder upon close examination and the Foley catheter was easily palpated.  A  19 round drain was placed into the abscess cavity through a right lower quadrant separate stab incision and secured with 2-0 nylon.  Both left and right ureters were preserved.  A circular incision was made in the left lower quadrant and the descending colon was brought out as an end colostomy.   Irrigation was used and about 4 L of irrigation were used to suction out the gross contamination.  Small bowel was run and no further areas of Crohn's were identified there.  Ascending, transverse and the remainder the descending colon and sigmoid colon and rectum appear grossly normal without signs of any gross Crohn's disease.  Stomach was grossly normal as well as liver, gallbladder and spleen.  Of note there was a fistulous tract from the abscess up to the umbilicus.  After irrigation until clear sponges were counted found to be correct.  I then ran the fascia closed with double-stranded PDS.  Incision was packed open.  Colostomy matured with 3-0 Vicryl and appliance applied.  Dry dressings applied.  All final counts were found to be correct.  The patient was awoke extubated taken to recovery in satisfactory condition.

## 2019-03-05 NOTE — ED Notes (Signed)
Attempted to call report x 2 to charge phone [phone]; no answer.

## 2019-03-05 NOTE — ED Provider Notes (Signed)
Physical Exam  BP 125/80 (BP Location: Right Arm)   Pulse (!) 105   Temp 98.2 F (36.8 C) (Oral)   Resp 18   Ht 5\' 10"  (1.778 m)   Wt 64.9 kg   SpO2 100%   BMI 20.52 kg/m   Physical Exam  ED Course/Procedures   Clinical Course as of Mar 04 2153  Sun Mar 05, 2019  [id number] 33 year old male presents with complaint of 2 weeks of periumbilical discomfort with occasional loose stools.  Patient was seen in urgent care today and sent to the ER when he was found to be febrile with a temperature of 101, patient was unaware he had a fever and has not taken antipyretics.  Patient arrived to the emergency room afebrile however through his course in the ER did develop a temperature of 101.1 and was given Tylenol.  Patient has remained tachycardic throughout his ER stay, blood pressure with slight decrease from 136/84 to currently 108/64.  Patient was given 2 L IV fluids as well as Rocephin and Flagyl for suspected intra-abdominal infection.  CBC with leukocytosis with white count of 15.7, urinalysis with ketones, CMP with hypokalemia with potassium of 2.9, orally repleted, bicarbonate 15.  Initial lactic acid was elevated at 2.3, repeat lactic acid of 1.2. Patient was evaluated early on by Dr. Erma Heritage, ER attending.  At change of shift patient was evaluated by Dr. Silverio Lay who performed bedside ultrasound. CT report returns with concern for perforation of the sigmoid colon.  Consult with Dr. Derrell Lolling with general surgery, request patient be transferred to [facility], ER for surgery to evaluate patient and develop plan.  Case discussed with Dr. Hyacinth Meeker, ER attending at [facility] emergency room who is aware of patient's pending arrival.   [LM]  1737 Discussed results and plan of care with patient, patient does not need any pain medication at this time, verbalizes understanding of plan of care.  Repeat abdominal exam remains unchanged, no specific tenderness/report of pain however patient is guarding.   [LM]     Clinical Course User Index [LM] Jeannie Fend, PA-C    Procedures  MDM  Medical screening examination/treatment/procedure(s) were conducted as a shared visit with non-physician practitioner(s) and myself.  I personally evaluated the patient during the encounter.  None  Sallie Mear is a 33 y.o. male here with right lower quadrant pain for the last 2 weeks.  Initial concern was for possible appendicitis.  Present ultrasound showed some free fluid but unable to identify the appendix.  His white blood cell count was elevated.  He was called by the PA to review his CT which showed a possible sigmoid perforation.  Patient was given antibiotics.  Dr. Derrell Lolling from surgery wants to see the patient at [facility].  Dr. Hyacinth Meeker in the ED aware and accepting transfer.   CRITICAL CARE Performed by: Richardean Canal   Total critical care time: 30 minutes  Critical care time was exclusive of separately billable procedures and treating other patients.  Critical care was necessary to treat or prevent imminent or life-threatening deterioration.  Critical care was time spent personally by me on the following activities: development of treatment plan with patient and/or surrogate as well as nursing, discussions with consultants, evaluation of patient's response to treatment, examination of patient, obtaining history from patient or surrogate, ordering and performing treatments and interventions, ordering and review of laboratory studies, ordering and review of radiographic studies, pulse oximetry and re-evaluation of patient's condition.  Charlynne PanderYao, Prosper Paff Hsienta, MD 03/05/19 2155

## 2019-03-05 NOTE — Anesthesia Procedure Notes (Signed)
Procedure Name: Intubation Date/Time: 03/05/2019 8:14 PM Performed by: Babs Bertin, CRNA Pre-anesthesia Checklist: Patient identified, Emergency Drugs available, Suction available and Patient being monitored Patient Re-evaluated:Patient Re-evaluated prior to induction Oxygen Delivery Method: Circle System Utilized Preoxygenation: Pre-oxygenation with 100% oxygen Induction Type: IV induction and Rapid sequence Laryngoscope Size: Mac and 3 Grade View: Grade I Tube type: Oral Tube size: 7.5 mm Number of attempts: 1 Airway Equipment and Method: Stylet and Oral airway Placement Confirmation: ETT inserted through vocal cords under direct vision,  positive ETCO2 and breath sounds checked- equal and bilateral Secured at: 22 cm Tube secured with: Tape Dental Injury: Teeth and Oropharynx as per pre-operative assessment

## 2019-03-05 NOTE — ED Provider Notes (Signed)
[facility] Provider Note   CSN: [id number] Arrival date & time: 03/05/19  1349    History   Chief Complaint Chief Complaint  Patient presents with   Abdominal Pain    HPI Neta EhlersBryceton Rison is a 33 y.o. male.     Patient presents for evaluation by general surgery, transferred from outside emergency room.  Patient had persistent gradually worsening pain lower abdomen on left side for the past 1 to 2 weeks.  Patient had fever earlier today and elevated heart rate.  No history of bowel issues or abdominal pelvic surgeries.  Patient has minimal discomfort at this time.     History reviewed. No pertinent past medical history.  There are no active problems to display for this patient.   History reviewed. No pertinent surgical history.      Home Medications    Prior to Admission medications   Not on File    Family History History reviewed. No pertinent family history.  Social History Social History   Tobacco Use   Smoking status: Never Smoker   Smokeless tobacco: Never Used  Substance Use Topics   Alcohol use: Never    Frequency: Never   Drug use: Never     Allergies   Patient has no known allergies.   Review of Systems Review of Systems  Constitutional: Negative for chills and fever.  HENT: Negative for congestion.   Eyes: Negative for visual disturbance.  Respiratory: Negative for shortness of breath.   Cardiovascular: Negative for chest pain.  Gastrointestinal: Positive for abdominal pain and nausea. Negative for vomiting.  Genitourinary: Negative for dysuria and flank pain.  Musculoskeletal: Negative for back pain, neck pain and neck stiffness.  Skin: Negative for rash.  Neurological: Negative for light-headedness and headaches.     Physical Exam Updated Vital Signs BP 108/64    Pulse (!) 112    Temp 98.6 F (37 C) (Oral)    Resp 20    Ht 5\' 10"  (1.778 m)    Wt 64.9 kg    SpO2 97%    BMI 20.52 kg/m    Physical Exam Vitals signs and nursing note reviewed.  Constitutional:      Appearance: He is well-developed.  HENT:     Head: Normocephalic and atraumatic.  Eyes:     General:        Right eye: No discharge.        Left eye: No discharge.     Conjunctiva/sclera: Conjunctivae normal.  Neck:     Musculoskeletal: Normal range of motion and neck supple.     Trachea: No tracheal deviation.  Cardiovascular:     Rate and Rhythm: Regular rhythm. Tachycardia present.  Pulmonary:     Effort: Pulmonary effort is normal.     Breath sounds: Normal breath sounds.  Abdominal:     General: There is no distension.     Palpations: Abdomen is soft.     Tenderness: There is abdominal tenderness (mild LLQ). There is guarding.  Skin:    General: Skin is warm.     Findings: No rash.  Neurological:     Mental Status: He is alert and oriented to person, place, and time.      ED Treatments / Results  Labs (all labs ordered are listed, but only abnormal results are displayed) Labs Reviewed  CBC WITH DIFFERENTIAL/PLATELET - Abnormal; Notable for the following components:      Result Value   WBC 15.7 (*)  Hemoglobin 11.3 (*)    HCT 36.5 (*)    Platelets 481 (*)    Neutro Abs 13.5 (*)    Abs Immature Granulocytes 0.08 (*)    All other components within normal limits  COMPREHENSIVE METABOLIC PANEL - Abnormal; Notable for the following components:   Potassium 2.9 (*)    Chloride 115 (*)    CO2 15 (*)    Calcium 7.0 (*)    Total Protein 5.6 (*)    Albumin 2.4 (*)    AST 11 (*)    All other components within normal limits  URINALYSIS, ROUTINE W REFLEX MICROSCOPIC - Abnormal; Notable for the following components:   pH 8.5 (*)    Ketones, ur 15 (*)    All other components within normal limits  LACTIC ACID, PLASMA - Abnormal; Notable for the following components:   Lactic Acid, Venous 2.3 (*)    All other components within normal limits  CULTURE, BLOOD (ROUTINE X 2)  CULTURE, BLOOD  (ROUTINE X 2)  LIPASE, BLOOD  LACTIC ACID, PLASMA    EKG None  Radiology Ct Abdomen Pelvis W Contrast  Result Date: 03/05/2019 CLINICAL DATA:  Abdominal pain EXAM: CT ABDOMEN AND PELVIS WITH CONTRAST TECHNIQUE: Multidetector CT imaging of the abdomen and pelvis was performed using the standard protocol following bolus administration of intravenous contrast. CONTRAST:  OMNIPAQUE IOHEXOL 300 MG/ML  SOLN COMPARISON:  None. FINDINGS: Lower chest: No acute abnormality. Hepatobiliary: No focal liver abnormality is seen. No gallstones, gallbladder wall thickening, or biliary dilatation. Pancreas: Unremarkable. No pancreatic ductal dilatation or surrounding inflammatory changes. Spleen: Normal in size without focal abnormality. Adrenals/Urinary Tract: Normal adrenal glands. Two hypodense, fluid attenuating left renal masses measuring 16 mm and 12 mm respectively consistent with cysts. No urolithiasis or obstructive uropathy. Generalized bladder wall thickening likely reactive secondary to adjacent inflammation from the contained perforation of sigmoid colon. Stomach/Bowel: Normal stomach. Normal appendix. Bowel wall thickening and surrounding inflammatory changes of the descending colon and sigmoid colon. 8.8 x 8 x 14 cm thick walled cavitary area along the sigmoid colon with an area of communication with the sigmoid colon on image 32/series 5 and appearing to decompress into the umbilicus. Cavitary area has a moderate amount of air and stool material within it. Small rim enhancing area along the mesenteric aspect of the sigmoid colon on image 53/series 2 may reflect a diverticulum versus small mural abscess with similar areas seen slightly more proximally on image 55 and 56. Vascular/Lymphatic: No significant vascular findings are present. No enlarged abdominal or pelvic lymph nodes. Reproductive: Prostate is unremarkable. Other: No abdominal wall hernia or abnormality. No abdominopelvic ascites.  Musculoskeletal: No acute or significant osseous findings. IMPRESSION: 1. Bowel wall thickening and surrounding inflammatory changes of the descending colon and sigmoid colon. 8.8 x 8 x 14 cm contained perforation arising from the sigmoid colon which continues to communicate with the sigmoid colon best seen on image 32/series 5 and decompressing into the umbilicus. Small rim enhancing areas are also seen along the wall of the sigmoid colon on image 53, 55 and 56 of series 2 which may reflect small diverticula versus mural abscesses. Differential considerations for overall process include an inflammatory etiology such as Crohn's disease versus diverticulitis which is considered less likely as there are no other diverticula visualized elsewhere and given the patient's age. Electronically Signed   By: Elige Ko   On: 03/05/2019 16:57    Procedures Procedures (including critical care time)  Medications Ordered  in ED Medications  0.9 %  sodium chloride infusion ( Intravenous Stopped 03/05/19 1749)  0.9 %  sodium chloride infusion (250 mLs Intravenous New Bag/Given 03/05/19 1716)  sodium chloride 0.9 % bolus 1,000 mL (0 mLs Intravenous Stopped 03/05/19 1546)  acetaminophen (TYLENOL) tablet 650 mg (650 mg Oral Given 03/05/19 1636)  potassium chloride SA (K-DUR) CR tablet 60 mEq (60 mEq Oral Given 03/05/19 1637)  sodium chloride 0.9 % bolus 1,000 mL ( Intravenous Stopped 03/05/19 1749)  cefTRIAXone (ROCEPHIN) 2 g in sodium chloride 0.9 % 100 mL IVPB ( Intravenous Stopped 03/05/19 1728)    And  metroNIDAZOLE (FLAGYL) IVPB 500 mg (500 mg Intravenous New Bag/Given 03/05/19 1717)  iohexol (OMNIPAQUE) 300 MG/ML solution 100 mL (100 mLs Intravenous Contrast Given 03/05/19 1626)     Initial Impression / Assessment and Plan / ED Course  I have reviewed the triage vital signs and the nursing notes.  Pertinent labs & imaging results that were available during my care of the patient were reviewed by me and considered  in my medical decision making (see chart for details).  Clinical Course as of Mar 05 1847  Sun Mar 05, 2019  6738 33 year old male presents with complaint of 2 weeks of periumbilical discomfort with occasional loose stools.  Patient was seen in urgent care today and sent to the ER when he was found to be febrile with a temperature of 101, patient was unaware he had a fever and has not taken antipyretics.  Patient arrived to the emergency room afebrile however through his course in the ER did develop a temperature of 101.1 and was given Tylenol.  Patient has remained tachycardic throughout his ER stay, blood pressure with slight decrease from 136/84 to currently 108/64.  Patient was given 2 L IV fluids as well as Rocephin and Flagyl for suspected intra-abdominal infection.  CBC with leukocytosis with white count of 15.7, urinalysis with ketones, CMP with hypokalemia with potassium of 2.9, orally repleted, bicarbonate 15.  Initial lactic acid was elevated at 2.3, repeat lactic acid of 1.2. Patient was evaluated early on by Dr. Erma Heritage, ER attending.  At change of shift patient was evaluated by Dr. Silverio Lay who performed bedside ultrasound. CT report returns with concern for perforation of the sigmoid colon.  Consult with Dr. Derrell Lolling with general surgery, request patient be transferred to [facility], ER for surgery to evaluate patient and develop plan.  Case discussed with Dr. Hyacinth Meeker, ER attending at [facility] emergency room who is aware of patient's pending arrival.   [LM]  1737 Discussed results and plan of care with patient, patient does not need any pain medication at this time, verbalizes understanding of plan of care.  Repeat abdominal exam remains unchanged, no specific tenderness/report of pain however patient is guarding.   [LM]    Clinical Course User Index [LM] Jeannie Fend, PA-C      Patient transferred over to [facility] for bowel perforation and concern for possible sepsis.  Patient had  blood work done and reviewed showing lactate 2.3, leukocytosis 15.  Plan for repeat fluid bolus.  Patient received Rocephin and Flagyl.  Patient currently has pain controlled, will monitor vital signs.  Discussed with general surgery for consult/admission.  Final Clinical Impressions(s) / ED Diagnoses   Final diagnoses:  Perforated bowel (HCC)  Hypokalemia  Fever in adult  Sepsis without acute organ dysfunction, due to unspecified organism [facility])    ED Discharge Orders    None  Blane Ohara, MD 03/07/19 [id number]

## 2019-03-05 NOTE — ED Triage Notes (Signed)
Reports abdominal pain with fever x 2.5 weeks.  Denies vomiting but endorses diarrhea.  Denies dysuria.

## 2019-03-05 NOTE — Anesthesia Preprocedure Evaluation (Signed)
Anesthesia Evaluation  Patient identified by MRN, date of birth, ID band Patient awake    Reviewed: Allergy & Precautions, NPO status , Patient's Chart, lab work & pertinent test results  History of Anesthesia Complications Negative for: history of anesthetic complications  Airway Mallampati: II  TM Distance: >3 FB Neck ROM: Full    Dental  (+) Teeth Intact   Pulmonary neg pulmonary ROS,    breath sounds clear to auscultation       Cardiovascular negative cardio ROS   Rhythm:Regular     Neuro/Psych negative neurological ROS     GI/Hepatic Neg liver ROS,  Colon Perforation   Endo/Other  negative endocrine ROS  Renal/GU negative Renal ROS     Musculoskeletal negative musculoskeletal ROS (+)   Abdominal   Peds  Hematology  (+) Blood dyscrasia, anemia ,   Anesthesia Other Findings   Reproductive/Obstetrics                             Anesthesia Physical Anesthesia Plan  ASA: I  Anesthesia Plan: General   Post-op Pain Management:    Induction: Intravenous, Rapid sequence and Cricoid pressure planned  PONV Risk Score and Plan: 2 and Ondansetron and Dexamethasone  Airway Management Planned: Oral ETT  Additional Equipment: None  Intra-op Plan:   Post-operative Plan: Extubation in OR  Informed Consent: I have reviewed the patients History and Physical, chart, labs and discussed the procedure including the risks, benefits and alternatives for the proposed anesthesia with the patient or authorized representative who has indicated his/her understanding and acceptance.     Dental advisory given  Plan Discussed with: CRNA and Surgeon  Anesthesia Plan Comments:         Anesthesia Quick Evaluation

## 2019-03-05 NOTE — ED Notes (Signed)
Lactic Acid 2.3, ED PA and RN informed

## 2019-03-06 ENCOUNTER — Encounter (HOSPITAL_COMMUNITY): Payer: Self-pay | Admitting: Surgery

## 2019-03-06 LAB — COMPREHENSIVE METABOLIC PANEL
ALT: 12 U/L (ref 0–44)
AST: 14 U/L — ABNORMAL LOW (ref 15–41)
Albumin: 2.3 g/dL — ABNORMAL LOW (ref 3.5–5.0)
Alkaline Phosphatase: 48 U/L (ref 38–126)
Anion gap: 12 (ref 5–15)
BUN: 6 mg/dL (ref 6–20)
CO2: 22 mmol/L (ref 22–32)
Calcium: 8.4 mg/dL — ABNORMAL LOW (ref 8.9–10.3)
Chloride: 105 mmol/L (ref 98–111)
Creatinine, Ser: 1.17 mg/dL (ref 0.61–1.24)
GFR calc Af Amer: >60 mL/min (ref >60)
GFR calc non Af Amer: >60 mL/min (ref >60)
Glucose, Bld: 198 mg/dL — ABNORMAL HIGH (ref 70–99)
Potassium: 4.6 mmol/L (ref 3.5–5.1)
Sodium: 139 mmol/L (ref 135–145)
Total Bilirubin: 0.7 mg/dL (ref 0.3–1.2)
Total Protein: 6.1 g/dL — ABNORMAL LOW (ref 6.5–8.1)

## 2019-03-06 LAB — CBC
HCT: 31.8 % — ABNORMAL LOW (ref 39.0–52.0)
Hemoglobin: 9.7 g/dL — ABNORMAL LOW (ref 13.0–17.0)
MCH: 26.5 pg (ref 26.0–34.0)
MCHC: 30.5 g/dL (ref 30.0–36.0)
MCV: 86.9 fL (ref 80.0–100.0)
Platelets: 437 10*3/uL — ABNORMAL HIGH (ref 150–400)
RBC: 3.66 MIL/uL — ABNORMAL LOW (ref 4.22–5.81)
RDW: 13.9 % (ref 11.5–15.5)
WBC: 11.9 10*3/uL — ABNORMAL HIGH (ref 4.0–10.5)
nRBC: 0 % (ref 0.0–0.2)

## 2019-03-06 LAB — HIV ANTIBODY (ROUTINE TESTING W REFLEX): HIV Screen 4th Generation wRfx: NONREACTIVE

## 2019-03-06 NOTE — Progress Notes (Signed)
Initial Nutrition Assessment  RD working remotely.  DOCUMENTATION CODES:   Not applicable  INTERVENTION:   -RD will follow for diet advancement and supplement as apprpriate  NUTRITION DIAGNOSIS:   Increased nutrient needs related to post-op healing as evidenced by estimated needs.  GOAL:   Patient will meet greater than or equal to 90% of their needs  MONITOR:   Diet advancement, Labs, Weight trends, Skin, I & O's  REASON FOR ASSESSMENT:   Malnutrition Screening Tool    ASSESSMENT:   [place] [facility] emergency room secondary to abdominal pain.  He presents to the emergency room this morning with progressive lower abdominal pain.  The pain service 2 weeks ago.  Is been associated with diarrhea.  There is been no blood in his stool.  He has no medical problems and takes no chronic medications.  CT scan showed perforation of his distal descending colon or sigmoid colon.  There is free air of through the umbilicus.  He was extremely tender in his lower abdomen.  He also had an elevated white count of 15,000 and a fever to 101.  He denies any recent history of cough, shortness of breath or other complicating issues.  Pt admitted with colonic perforation with peritonitis.   4/19- s/p Procedure: Exploratory laparotomy with resection of distal descending colon and drainage of large intra-abdominal abscess with end colostomy  Reviewed I/O's: +4.2 L since admission  UOP: 1 L x 24 hours  Drains: 252 ml x 24 hours  Pt NPO, with sips and chips from the floor. No ostomy output currently. Per chart review pt is hungry.   No wt hx available to assess. Per MST report, pt reports recent unintentional wt loss.   Labs reviewed: K: 2.9.   NUTRITION - FOCUSED PHYSICAL EXAM:    Most Recent Value  Orbital Region  Unable to assess  Upper Arm Region  Unable to assess  Thoracic and Lumbar Region  Unable to assess  Buccal Region  Unable to assess  Temple Region   Unable to assess  Clavicle Bone Region  Unable to assess  Clavicle and Acromion Bone Region  Unable to assess  Scapular Bone Region  Unable to assess  Dorsal Hand  Unable to assess  Patellar Region  Unable to assess  Anterior Thigh Region  Unable to assess  Posterior Calf Region  Unable to assess  Edema (RD Assessment)  Unable to assess  Hair  Unable to assess  Eyes  Unable to assess  Mouth  Unable to assess  Skin  Unable to assess  Nails  Unable to assess       Diet Order:   Diet Order            Diet NPO time specified Except for: Ice Chips, Sips with Meds, Other (See Comments)  Diet effective now              EDUCATION NEEDS:   No education needs have been identified at this time  Skin:  Skin Assessment: Skin Integrity Issues: Skin Integrity Issues:: Incisions Incisions: closed abdomen  Last BM:  03/05/19  Height:   Ht Readings from Last 1 Encounters:  03/05/19 5\' 10"  (1.778 m)    Weight:   Wt Readings from Last 1 Encounters:  03/05/19 64.9 kg    Ideal Body Weight:  75.5 kg  BMI:  Body mass index is 20.52 kg/m.  Estimated Nutritional Needs:   Kcal:  1850-2050  Protein:  105-120  grams  Fluid:  > 1.8 L    Corean Yoshimura A. Jimmye Norman, RD, LDN, [place] Registered Dietitian II Certified Diabetes Care and Education Specialist Pager: [phone] After hours Pager: [phone]

## 2019-03-06 NOTE — Consult Note (Signed)
WOC Nurse ostomy consult note Stoma type/location: LLQ colostomy/Hartmann procedure.  Patient is not having output at this time.  He isin a lot of pain and does not want a pouch change.  We simulated emptying pouch today. He understands to empty when 1/3 full. He agrees to practice with staff.  He is given written materials today for review.   Stomal assessment/size: 1 1/4" pink moist and round.  Peristomal assessment: pouch intact  Not assessed Treatment options for stomal/peristomal skin: 2 piece pouch Output none Ostomy pouching: 2pc. 2 1/4" pouches ordered Education provided: See above  Will perform pouch change Wednesday.  Enrolled patient in [employer] DC program: No WOC team will follow.  Maple Hudson MSN, RN, FNP-BC CWON Wound, Ostomy, Continence Nurse Pager [phone]

## 2019-03-06 NOTE — Progress Notes (Signed)
[facility] Progress Note  1 Day Post-Op  Subjective: CC: abdominal pain Patient reports overall pain medication is helping but has incisional pain. Also feels hungry. Denies nausea or vomiting. Unsure of flatus.  Objective: Vital signs in last 24 hours: Temp:  [97.5 F (36.4 C)-101.1 F (38.4 C)] 97.5 F (36.4 C) (04/20 0526) Pulse Rate:  [78-124] 84 (04/20 0526) Resp:  [10-22] 17 (04/20 0526) BP: (103-137)/(64-99) 129/69 (04/20 0526) SpO2:  [96 %-100 %] 96 % (04/20 0526) Weight:  [64.9 kg] 64.9 kg (04/19 1357) Last BM Date: 03/05/19  Intake/Output from previous day: 04/19 0701 - 04/20 0700 In: 5563.7 [I.V.:2784.3; IV Piggyback:2779.4] Out: 1352 [Urine:1000; Drains:252; Blood:100] Intake/Output this shift: Total I/O In: 0  Out: 70 [Drains:70]  PE: Gen:  Alert, NAD, pleasant Card:  Regular rate and rhythm Pulm:  Normal effort, clear to auscultation bilaterally Abd: Soft, appropriately tender, non-distended, BS hypoactive, stoma pink - no stool or gas in ostomy bag, midline wound clean with minimal bloody drainage, drain with SS fluid Skin: warm and dry, no rashes  Psych: A&Ox3   Lab Results:  Recent Labs    03/05/19 1431 03/06/19 0701  WBC 15.7* 11.9*  HGB 11.3* 9.7*  HCT 36.5* 31.8*  PLT 481* 437*   BMET Recent Labs    03/05/19 1431 03/06/19 0701  NA 137 139  K 2.9* 4.6  CL 115* 105  CO2 15* 22  GLUCOSE 90 198*  BUN 9 6  CREATININE 0.62 1.17  CALCIUM 7.0* 8.4*   PT/INR No results for input(s): LABPROT, INR in the last 72 hours. CMP     Component Value Date/Time   NA 139 03/06/2019 0701   K 4.6 03/06/2019 0701   CL 105 03/06/2019 0701   CO2 22 03/06/2019 0701   GLUCOSE 198 (H) 03/06/2019 0701   BUN 6 03/06/2019 0701   CREATININE 1.17 03/06/2019 0701   CALCIUM 8.4 (L) 03/06/2019 0701   PROT 6.1 (L) 03/06/2019 0701   ALBUMIN 2.3 (L) 03/06/2019 0701   AST 14 (L) 03/06/2019 0701   ALT 12 03/06/2019 0701   ALKPHOS 48 03/06/2019  0701   BILITOT 0.7 03/06/2019 0701   GFRNONAA >60 03/06/2019 0701   GFRAA >60 03/06/2019 0701   Lipase     Component Value Date/Time   LIPASE 17 03/05/2019 1431       Studies/Results: Ct Abdomen Pelvis W Contrast  Result Date: 03/05/2019 CLINICAL DATA:  Abdominal pain EXAM: CT ABDOMEN AND PELVIS WITH CONTRAST TECHNIQUE: Multidetector CT imaging of the abdomen and pelvis was performed using the standard protocol following bolus administration of intravenous contrast. CONTRAST:  OMNIPAQUE IOHEXOL 300 MG/ML  SOLN COMPARISON:  None. FINDINGS: Lower chest: No acute abnormality. Hepatobiliary: No focal liver abnormality is seen. No gallstones, gallbladder wall thickening, or biliary dilatation. Pancreas: Unremarkable. No pancreatic ductal dilatation or surrounding inflammatory changes. Spleen: Normal in size without focal abnormality. Adrenals/Urinary Tract: Normal adrenal glands. Two hypodense, fluid attenuating left renal masses measuring 16 mm and 12 mm respectively consistent with cysts. No urolithiasis or obstructive uropathy. Generalized bladder wall thickening likely reactive secondary to adjacent inflammation from the contained perforation of sigmoid colon. Stomach/Bowel: Normal stomach. Normal appendix. Bowel wall thickening and surrounding inflammatory changes of the descending colon and sigmoid colon. 8.8 x 8 x 14 cm thick walled cavitary area along the sigmoid colon with an area of communication with the sigmoid colon on image 32/series 5 and appearing to decompress into the umbilicus. Cavitary area has a  moderate amount of air and stool material within it. Small rim enhancing area along the mesenteric aspect of the sigmoid colon on image 53/series 2 may reflect a diverticulum versus small mural abscess with similar areas seen slightly more proximally on image 55 and 56. Vascular/Lymphatic: No significant vascular findings are present. No enlarged abdominal or pelvic lymph nodes.  Reproductive: Prostate is unremarkable. Other: No abdominal wall hernia or abnormality. No abdominopelvic ascites. Musculoskeletal: No acute or significant osseous findings. IMPRESSION: 1. Bowel wall thickening and surrounding inflammatory changes of the descending colon and sigmoid colon. 8.8 x 8 x 14 cm contained perforation arising from the sigmoid colon which continues to communicate with the sigmoid colon best seen on image 32/series 5 and decompressing into the umbilicus. Small rim enhancing areas are also seen along the wall of the sigmoid colon on image 53, 55 and 56 of series 2 which may reflect small diverticula versus mural abscesses. Differential considerations for overall process include an inflammatory etiology such as Crohn's disease versus diverticulitis which is considered less likely as there are no other diverticula visualized elsewhere and given the patient's age. Electronically Signed   By: Elige KoHetal  Patel   On: 03/05/2019 16:57    Anti-infectives: Anti-infectives (From admission, onward)   Start     Dose/Rate Route Frequency Ordered Stop   03/06/19 1600  cefTRIAXone (ROCEPHIN) 2 g in sodium chloride 0.9 % 100 mL IVPB     2 g 200 mL/hr over 30 Minutes Intravenous Every 24 hours 03/05/19 2158     03/06/19 0600  cefTRIAXone (ROCEPHIN) 2 g in sodium chloride 0.9 % 100 mL IVPB  Status:  Discontinued     2 g 200 mL/hr over 30 Minutes Intravenous On call to O.R. 03/05/19 2326 03/05/19 2329   03/05/19 2326  metroNIDAZOLE (FLAGYL) IVPB 500 mg  Status:  Discontinued     500 mg 100 mL/hr over 60 Minutes Intravenous Every 8 hours 03/05/19 2326 03/05/19 2329   03/05/19 2158  metroNIDAZOLE (FLAGYL) IVPB 500 mg     500 mg 100 mL/hr over 60 Minutes Intravenous Every 8 hours 03/05/19 2158     03/05/19 1615  cefTRIAXone (ROCEPHIN) 2 g in sodium chloride 0.9 % 100 mL IVPB     2 g 200 mL/hr over 30 Minutes Intravenous  Once 03/05/19 1613 03/05/19 1728   03/05/19 1615  metroNIDAZOLE (FLAGYL) IVPB  500 mg     500 mg 100 mL/hr over 60 Minutes Intravenous  Once 03/05/19 1613 03/05/19 1817       Assessment/Plan  Perforation of distal descending colon with large intra-abdominal abscess - s/p exploratory laparotomy with Hartman's procedure 03/05/19 Dr. Luisa Hartornett - POD#1 - surgical pathology pending - WBC 11.9, afebrile - continue abx - JP with >300 out, SS - start ice chips and sips of clears and await return of bowel function - mobilize - d/c foley  - WOC consult for new colostomy   AKI - Cr 1.17 from 0.62, continue IVF and monitor  FEN: sips and ice chips, IVF VTE: SCDs, lovenox ID: [email or web address] 4/19>>   LOS: 1 day    Wells GuilesKelly Rayburn , [facility] 03/06/2019, 10:15 AM Pager: [phone] Consults: [phone]

## 2019-03-06 NOTE — Progress Notes (Signed)
Pt arrived to floor to 6N room 7 via stretcher from PACU. Alert and verbal. No s/s of respiratory distress noted.

## 2019-03-06 NOTE — TOC Initial Note (Signed)
Transition of Care [facility]) - Initial/Assessment Note    Patient Details  Name: Kyle Moss MRN: [id number] Date of Birth: December 24, 1985  [facility]) CM/SW Contact:    Kingsley Plan, RN Phone Number: 03/06/2019, 3:09 PM  Clinical Narrative:                  Confirmed face sheet information.   Patient does not have a PCP. Explained he can call number on insurance card and be provided a full list of MD's in network. Patient states he will do this.    Patient from home with parents and brother. Explained prior to  Discharge patient and family member will be taught ostomy and wound care, because [facility] will not make daily visits. Patient voiced understanding. Provided and left at bedside list of home health agencies.   Will continue to follow. Will need order for home health RN with wound care instructions.  Expected Discharge Plan: Home w Home Health Services Barriers to Discharge: Continued Medical Work up   Patient Goals and CMS Choice Patient states their goals for this hospitalization and ongoing recovery are:: to go home  CMS Medicare.gov Compare Post Acute Care list provided to:: Patient Choice offered to / list presented to : Patient  Expected Discharge Plan and Services Expected Discharge Plan: Home w Home Health Services   Discharge Planning Services: CM Consult Post Acute Care Choice: Home Health Living arrangements for the past 2 months: Single Family Home                   DME Agency: NA HH Arranged: NA    Prior Living Arrangements/Services Living arrangements for the past 2 months: Single Family Home Lives with:: Parents Patient language and need for interpreter reviewed:: Yes Do you feel safe going back to the place where you live?: Yes            Criminal Activity/Legal Involvement Pertinent to Current Situation/Hospitalization: No - Comment as needed  Activities of Daily Living Home Assistive Devices/Equipment: None ADL Screening  (condition at time of admission) Patient's cognitive ability adequate to safely complete daily activities?: Yes Is the patient deaf or have difficulty hearing?: No Does the patient have difficulty seeing, even when wearing glasses/contacts?: No Does the patient have difficulty concentrating, remembering, or making decisions?: No Patient able to express need for assistance with ADLs?: Yes Does the patient have difficulty dressing or bathing?: No Independently performs ADLs?: Yes (appropriate for developmental age) Does the patient have difficulty walking or climbing stairs?: No Weakness of Legs: None Weakness of Arms/Hands: None  Permission Sought/Granted                  Emotional Assessment Appearance:: Appears stated age Attitude/Demeanor/Rapport: Engaged Affect (typically observed): Accepting Orientation: : Oriented to Self, Oriented to Place, Oriented to  Time, Oriented to Situation   Psych Involvement: No (comment)  Admission diagnosis:  Hypokalemia [E87.6] Perforated bowel (HCC) [K63.1] Fever in adult [R50.9] Sepsis without acute organ dysfunction, due to unspecified organism [facility]) [A41.9] Patient Active Problem List   Diagnosis Date Noted  . Colon perforation (HCC) 03/05/2019   PCP:  Patient, No Pcp Per Pharmacy:   [id number] [place], [place] - [address] AT [facility] [address] [facility] [address] [place] [address] Phone: [phone] Fax: [phone]     Social Determinants of Health (SDOH) Interventions    Readmission Risk Interventions No flowsheet data found.

## 2019-03-06 NOTE — Plan of Care (Signed)
  Problem: Education: Goal: Ability to identify and develop effective coping behavior will improve Outcome: Progressing   Problem: Health Behavior/Discharge Planning: Goal: Identification of resources available to assist in meeting health care needs will improve Outcome: Progressing   Problem: Self-Concept: Goal: Expressions of positive opinion of body image will increase Outcome: Progressing Goal: Will verbalize positive feelings about self Outcome: Progressing

## 2019-03-06 NOTE — Progress Notes (Signed)
Received report from [place], RN from PACU.

## 2019-03-07 LAB — BASIC METABOLIC PANEL
Anion gap: 8 (ref 5–15)
BUN: 9 mg/dL (ref 6–20)
CO2: 21 mmol/L — ABNORMAL LOW (ref 22–32)
Calcium: 7.9 mg/dL — ABNORMAL LOW (ref 8.9–10.3)
Chloride: 110 mmol/L (ref 98–111)
Creatinine, Ser: 0.88 mg/dL (ref 0.61–1.24)
GFR calc Af Amer: >60 mL/min (ref >60)
GFR calc non Af Amer: >60 mL/min (ref >60)
Glucose, Bld: 161 mg/dL — ABNORMAL HIGH (ref 70–99)
Potassium: 4.1 mmol/L (ref 3.5–5.1)
Sodium: 139 mmol/L (ref 135–145)

## 2019-03-07 LAB — CBC
HCT: 28.2 % — ABNORMAL LOW (ref 39.0–52.0)
Hemoglobin: 9.1 g/dL — ABNORMAL LOW (ref 13.0–17.0)
MCH: 27.5 pg (ref 26.0–34.0)
MCHC: 32.3 g/dL (ref 30.0–36.0)
MCV: 85.2 fL (ref 80.0–100.0)
Platelets: 417 10*3/uL — ABNORMAL HIGH (ref 150–400)
RBC: 3.31 MIL/uL — ABNORMAL LOW (ref 4.22–5.81)
RDW: 14 % (ref 11.5–15.5)
WBC: 20.6 10*3/uL — ABNORMAL HIGH (ref 4.0–10.5)
nRBC: 0 % (ref 0.0–0.2)

## 2019-03-07 MED ORDER — PROMETHAZINE HCL 25 MG/ML IJ SOLN
12.5000 mg | Freq: Four times a day (QID) | INTRAMUSCULAR | Status: DC | PRN
  Administered 2019-03-07 – 2019-03-09 (×5): 12.5 mg via INTRAVENOUS
  Filled 2019-03-07 (×5): qty 1

## 2019-03-07 NOTE — Plan of Care (Signed)
  Problem: Nutrition: Goal: Adequate nutrition will be maintained Outcome: Progressing   Problem: Elimination: Goal: Will not experience complications related to bowel motility Outcome: Progressing   Problem: Pain Managment: Goal: General experience of comfort will improve Outcome: Progressing   

## 2019-03-07 NOTE — Progress Notes (Signed)
Pt vomited this AM, MD aware, PRN med given. Pt vomited again at 14:28, notified Tresa Endo, [place], received and carried out verbal order. Will continue to monitor.

## 2019-03-07 NOTE — TOC Progression Note (Signed)
Transition of Care [facility]) - Progression Note    Patient Details  Name: Nath Carter MRN: [id number] Date of Birth: 07-20-1986  [facility]) CM/SW Contact  Nadene Rubins Adria Devon, RN Phone Number: 03/07/2019, 3:45 PM  Clinical Narrative:     Received order for home health RN. Spoke to patient. He has Medicare.gov list at bedside but has not decided which agency. Will continue to follow.  Expected Discharge Plan: Home w Home Health Services Barriers to Discharge: Continued Medical Work up  Expected Discharge Plan and Services Expected Discharge Plan: Home w Home Health Services   Discharge Planning Services: CM Consult Post Acute Care Choice: Home Health Living arrangements for the past 2 months: Single Family Home                   DME Agency: NA HH Arranged: NA     Social Determinants of Health (SDOH) Interventions    Readmission Risk Interventions No flowsheet data found.

## 2019-03-07 NOTE — Progress Notes (Addendum)
Pt denies pain upon initial rounds, Stated " I can wait for the scheduled dose of Toradol at midnight". L colostomy site, appliance intact, noted flatus and hypoactive bowel sounds. R J.P. with scant amount of serosanguinous drainage. IVF's infusing without difficulty. Pt uses urinal to void. Call light and phone in reach, rounds maintained per MD orders and unit policy.    Pt with call out for nausea, prn Zofran given per MD order. Pt with approx. 50 ml of clear dark green emesis.

## 2019-03-07 NOTE — Plan of Care (Signed)
  Problem: Self-Concept: Goal: Expressions of positive opinion of body image will increase Outcome: Progressing   Problem: Clinical Measurements: Goal: Ability to maintain clinical measurements within normal limits will improve Outcome: Progressing   Problem: Activity: Goal: Risk for activity intolerance will decrease Outcome: Progressing   Problem: Elimination: Goal: Will not experience complications related to bowel motility Outcome: Progressing   Problem: Pain Managment: Goal: General experience of comfort will improve Outcome: Progressing   Problem: Safety: Goal: Ability to remain free from injury will improve Outcome: Progressing

## 2019-03-07 NOTE — Progress Notes (Signed)
Dressing changed per MD's order, tolerated well with some discomfort.

## 2019-03-07 NOTE — Progress Notes (Signed)
2 Days Post-Op   Subjective/Chief Complaint: Patient with nausea/ vomiting x 2 Pain adequately controlled Feels some rumbling in abdomen No flatus in ostomy bag   Objective: Vital signs in last 24 hours: Temp:  [98.2 F (36.8 C)-98.7 F (37.1 C)] 98.7 F (37.1 C) (04/21 0453) Pulse Rate:  [83-97] 97 (04/21 0453) Resp:  [16-20] 16 (04/21 0453) BP: (115-124)/(71-74) 116/73 (04/21 0453) SpO2:  [98 %-100 %] 98 % (04/21 0453) Last BM Date: 03/05/19  Intake/Output from previous day: 04/20 0701 - 04/21 0700 In: 250 [P.O.:50; IV Piggyback:200] Out: 500 [Urine:200; Emesis/NG output:50; Drains:240; Stool:10] Intake/Output this shift: Total I/O In: 40 [Other:40] Out: 50 [Urine:50]  General appearance: alert, cooperative and no distress GI: soft, incisional tenderness, minimal bowel sounds Incision - open, clean, minimal drainage  Ostomy - pink, viable; no output Drain - serosanguinous fluid  Lab Results:  Recent Labs    03/06/19 0701 03/07/19 0126  WBC 11.9* 20.6*  HGB 9.7* 9.1*  HCT 31.8* 28.2*  PLT 437* 417*   BMET Recent Labs    03/06/19 0701 03/07/19 0126  NA 139 139  K 4.6 4.1  CL 105 110  CO2 22 21*  GLUCOSE 198* 161*  BUN 6 9  CREATININE 1.17 0.88  CALCIUM 8.4* 7.9*   PT/INR No results for input(s): LABPROT, INR in the last 72 hours. ABG No results for input(s): PHART, HCO3 in the last 72 hours.  Invalid input(s): PCO2, PO2  Studies/Results: Ct Abdomen Pelvis W Contrast  Result Date: 03/05/2019 CLINICAL DATA:  Abdominal pain EXAM: CT ABDOMEN AND PELVIS WITH CONTRAST TECHNIQUE: Multidetector CT imaging of the abdomen and pelvis was performed using the standard protocol following bolus administration of intravenous contrast. CONTRAST:  OMNIPAQUE IOHEXOL 300 MG/ML  SOLN COMPARISON:  None. FINDINGS: Lower chest: No acute abnormality. Hepatobiliary: No focal liver abnormality is seen. No gallstones, gallbladder wall thickening, or biliary dilatation.  Pancreas: Unremarkable. No pancreatic ductal dilatation or surrounding inflammatory changes. Spleen: Normal in size without focal abnormality. Adrenals/Urinary Tract: Normal adrenal glands. Two hypodense, fluid attenuating left renal masses measuring 16 mm and 12 mm respectively consistent with cysts. No urolithiasis or obstructive uropathy. Generalized bladder wall thickening likely reactive secondary to adjacent inflammation from the contained perforation of sigmoid colon. Stomach/Bowel: Normal stomach. Normal appendix. Bowel wall thickening and surrounding inflammatory changes of the descending colon and sigmoid colon. 8.8 x 8 x 14 cm thick walled cavitary area along the sigmoid colon with an area of communication with the sigmoid colon on image 32/series 5 and appearing to decompress into the umbilicus. Cavitary area has a moderate amount of air and stool material within it. Small rim enhancing area along the mesenteric aspect of the sigmoid colon on image 53/series 2 may reflect a diverticulum versus small mural abscess with similar areas seen slightly more proximally on image 55 and 56. Vascular/Lymphatic: No significant vascular findings are present. No enlarged abdominal or pelvic lymph nodes. Reproductive: Prostate is unremarkable. Other: No abdominal wall hernia or abnormality. No abdominopelvic ascites. Musculoskeletal: No acute or significant osseous findings. IMPRESSION: 1. Bowel wall thickening and surrounding inflammatory changes of the descending colon and sigmoid colon. 8.8 x 8 x 14 cm contained perforation arising from the sigmoid colon which continues to communicate with the sigmoid colon best seen on image 32/series 5 and decompressing into the umbilicus. Small rim enhancing areas are also seen along the wall of the sigmoid colon on image 53, 55 and 56 of series 2 which may reflect  small diverticula versus mural abscesses. Differential considerations for overall process include an inflammatory  etiology such as Crohn's disease versus diverticulitis which is considered less likely as there are no other diverticula visualized elsewhere and given the patient's age. Electronically Signed   By: Elige KoHetal  Patel   On: 03/05/2019 16:57    Anti-infectives: Anti-infectives (From admission, onward)   Start     Dose/Rate Route Frequency Ordered Stop   03/06/19 1600  cefTRIAXone (ROCEPHIN) 2 g in sodium chloride 0.9 % 100 mL IVPB     2 g 200 mL/hr over 30 Minutes Intravenous Every 24 hours 03/05/19 2158     03/06/19 0600  cefTRIAXone (ROCEPHIN) 2 g in sodium chloride 0.9 % 100 mL IVPB  Status:  Discontinued     2 g 200 mL/hr over 30 Minutes Intravenous On call to O.R. 03/05/19 2326 03/05/19 2329   03/05/19 2326  metroNIDAZOLE (FLAGYL) IVPB 500 mg  Status:  Discontinued     500 mg 100 mL/hr over 60 Minutes Intravenous Every 8 hours 03/05/19 2326 03/05/19 2329   03/05/19 2158  metroNIDAZOLE (FLAGYL) IVPB 500 mg     500 mg 100 mL/hr over 60 Minutes Intravenous Every 8 hours 03/05/19 2158     03/05/19 1615  cefTRIAXone (ROCEPHIN) 2 g in sodium chloride 0.9 % 100 mL IVPB     2 g 200 mL/hr over 30 Minutes Intravenous  Once 03/05/19 1613 03/05/19 1728   03/05/19 1615  metroNIDAZOLE (FLAGYL) IVPB 500 mg     500 mg 100 mL/hr over 60 Minutes Intravenous  Once 03/05/19 1613 03/05/19 1817      Assessment/Plan: Perforation of distal descending colon with large intra-abdominal abscess - s/p exploratory laparotomy with Hartman's procedure 03/05/19 Dr. Luisa Hartornett - POD#2 - surgical pathology pending - WBC 20.6, afebrile - continue abx - JP with >200 out, SS - start ice chips and sips of clears and await return of bowel function - mobilize - d/c foley  - WOC consult for new colostomy   AKI - Cr back down to 0.88 from 1.17, continue IVF and monitor Dressing changes  FEN: sips and ice chips, IVF VTE: SCDs, lovenox ID: [email or web address] 4/19>>  LOS: 2 days    Wynona LunaMatthew K Ellajane Stong 03/07/2019

## 2019-03-08 LAB — BASIC METABOLIC PANEL
Anion gap: 8 (ref 5–15)
BUN: 9 mg/dL (ref 6–20)
CO2: 22 mmol/L (ref 22–32)
Calcium: 7.6 mg/dL — ABNORMAL LOW (ref 8.9–10.3)
Chloride: 112 mmol/L — ABNORMAL HIGH (ref 98–111)
Creatinine, Ser: 0.86 mg/dL (ref 0.61–1.24)
GFR calc Af Amer: >60 mL/min (ref >60)
GFR calc non Af Amer: >60 mL/min (ref >60)
Glucose, Bld: 136 mg/dL — ABNORMAL HIGH (ref 70–99)
Potassium: 3.9 mmol/L (ref 3.5–5.1)
Sodium: 142 mmol/L (ref 135–145)

## 2019-03-08 LAB — CBC
HCT: 27.1 % — ABNORMAL LOW (ref 39.0–52.0)
Hemoglobin: 8.5 g/dL — ABNORMAL LOW (ref 13.0–17.0)
MCH: 26.8 pg (ref 26.0–34.0)
MCHC: 31.4 g/dL (ref 30.0–36.0)
MCV: 85.5 fL (ref 80.0–100.0)
Platelets: 437 10*3/uL — ABNORMAL HIGH (ref 150–400)
RBC: 3.17 MIL/uL — ABNORMAL LOW (ref 4.22–5.81)
RDW: 14 % (ref 11.5–15.5)
WBC: 16.1 10*3/uL — ABNORMAL HIGH (ref 4.0–10.5)
nRBC: 0 % (ref 0.0–0.2)

## 2019-03-08 MED ORDER — ONDANSETRON HCL 4 MG/2ML IJ SOLN
4.0000 mg | Freq: Four times a day (QID) | INTRAMUSCULAR | Status: DC | PRN
  Administered 2019-03-08 – 2019-03-10 (×2): 4 mg via INTRAVENOUS
  Filled 2019-03-08 (×2): qty 2

## 2019-03-08 MED ORDER — ONDANSETRON HCL 4 MG PO TABS
4.0000 mg | ORAL_TABLET | Freq: Once | ORAL | Status: AC
  Administered 2019-03-08: 17:00:00 4 mg via ORAL
  Filled 2019-03-08: qty 1

## 2019-03-08 MED ORDER — ONDANSETRON HCL 4 MG/2ML IJ SOLN: 4.0000 mg | Freq: Four times a day (QID) | INTRAMUSCULAR | Status: DC | PRN

## 2019-03-08 MED ORDER — ADULT MULTIVITAMIN W/MINERALS CH
1.0000 | ORAL_TABLET | Freq: Every day | ORAL | Status: DC
  Administered 2019-03-10 – 2019-03-13 (×4): 1 via ORAL
  Filled 2019-03-08 (×5): qty 1

## 2019-03-08 MED ORDER — ONDANSETRON 4 MG PO TBDP: 4.0000 mg | ORAL_TABLET | Freq: Four times a day (QID) | ORAL | Status: DC | PRN

## 2019-03-08 NOTE — Progress Notes (Signed)
3 Days Post-Op   Subjective/Chief Complaint: No further nausea today Feeling some more movement in abdomen  Path report - diverticulitis; no sign of malignancy or Crohns' disease   Objective: Vital signs in last 24 hours: Temp:  [98 F (36.7 C)-98.5 F (36.9 C)] 98 F (36.7 C) (04/22 0538) Pulse Rate:  [74-91] 74 (04/22 0538) Resp:  [16-17] 17 (04/22 0538) BP: (115-120)/(71-73) 117/73 (04/22 0538) SpO2:  [98 %-100 %] 99 % (04/22 0538) Last BM Date: 03/05/19  Intake/Output from previous day: 04/21 0701 - 04/22 0700 In: 5403.4 [I.V.:5003.3; IV Piggyback:400.1] Out: 1715 [Urine:450; Emesis/NG output:1000; Drains:265] Intake/Output this shift: No intake/output data recorded.  General appearance: alert, cooperative and no distress GI: soft, incisional tenderness; non-distended; + BS LLQ ostomy - pink, viable; stool coming out of stoma Midline wound - clean, minimal drainage  Lab Results:  Recent Labs    03/07/19 0126 03/08/19 0015  WBC 20.6* 16.1*  HGB 9.1* 8.5*  HCT 28.2* 27.1*  PLT 417* 437*   BMET Recent Labs    03/07/19 0126 03/08/19 0227  NA 139 142  K 4.1 3.9  CL 110 112*  CO2 21* 22  GLUCOSE 161* 136*  BUN 9 9  CREATININE 0.88 0.86  CALCIUM 7.9* 7.6*   PT/INR No results for input(s): LABPROT, INR in the last 72 hours. ABG No results for input(s): PHART, HCO3 in the last 72 hours.  Invalid input(s): PCO2, PO2  Studies/Results: No results found.  Anti-infectives: Anti-infectives (From admission, onward)   Start     Dose/Rate Route Frequency Ordered Stop   03/06/19 1600  cefTRIAXone (ROCEPHIN) 2 g in sodium chloride 0.9 % 100 mL IVPB     2 g 200 mL/hr over 30 Minutes Intravenous Every 24 hours 03/05/19 2158     03/06/19 0600  cefTRIAXone (ROCEPHIN) 2 g in sodium chloride 0.9 % 100 mL IVPB  Status:  Discontinued     2 g 200 mL/hr over 30 Minutes Intravenous On call to O.R. 03/05/19 2326 03/05/19 2329   03/05/19 2326  metroNIDAZOLE (FLAGYL)  IVPB 500 mg  Status:  Discontinued     500 mg 100 mL/hr over 60 Minutes Intravenous Every 8 hours 03/05/19 2326 03/05/19 2329   03/05/19 2158  metroNIDAZOLE (FLAGYL) IVPB 500 mg     500 mg 100 mL/hr over 60 Minutes Intravenous Every 8 hours 03/05/19 2158     03/05/19 1615  cefTRIAXone (ROCEPHIN) 2 g in sodium chloride 0.9 % 100 mL IVPB     2 g 200 mL/hr over 30 Minutes Intravenous  Once 03/05/19 1613 03/05/19 1728   03/05/19 1615  metroNIDAZOLE (FLAGYL) IVPB 500 mg     500 mg 100 mL/hr over 60 Minutes Intravenous  Once 03/05/19 1613 03/05/19 1817      Assessment/Plan: Perforation of distal descending colon with large intra-abdominal abscess - s/p exploratory laparotomy with Hartman's procedure 03/05/19 Dr. Luisa Hart - POD#3 - surgical pathology pending - WBC 16.1, trending down, afebrile- continue abx - JP with >200 out, SS - start clear liquids - mobilize  - WOC consult for new colostomy  AKI- Cr back down to 0.88 from 1.17, continue IVF and monitor Dressing changes  FEN: clear liquids, IVF VTE: SCDs, lovenox ID: rocephin/flagyl 4/19>>  LOS: 3 days    Wynona Luna 03/08/2019

## 2019-03-08 NOTE — Consult Note (Addendum)
WOC Nurse ostomy consult note Stoma type/location:  Colostomy to LLQ Stomal assessment/size: Stoma is red and viable, flush with skin level, 1 1/2 inches Peristomal assessment: intact skin surrounding Output: 50cc liquid brown stool Ostomy pouching: 2pc.  Education provided:  Demonstrated pouch change; pt asked appropriate questions and was able to open and close velcro to empty. Reviewed pouching routines and ordering supplies.  Enrolled patient in [employer] DC program: Yes WOC team will continue to follow while in the hospital. Educational materials at the bedside and supplies ordered to the bedside.  Cammie Mcgee MSN, RN, CWOCN, [place], CNS [phone]

## 2019-03-08 NOTE — Plan of Care (Signed)
  Problem: Education: Goal: Ability to identify and develop effective coping behavior will improve Outcome: Progressing   Problem: Health Behavior/Discharge Planning: Goal: Identification of resources available to assist in meeting health care needs will improve Outcome: Progressing   Problem: Self-Concept: Goal: Expressions of positive opinion of body image will increase Outcome: Progressing Goal: Will verbalize positive feelings about self Outcome: Progressing   Problem: Education: Goal: Knowledge of General Education information will improve Description Including pain rating scale, medication(s)/side effects and non-pharmacologic comfort measures Outcome: Progressing   Problem: Health Behavior/Discharge Planning: Goal: Ability to manage health-related needs will improve Outcome: Progressing   Problem: Clinical Measurements: Goal: Ability to maintain clinical measurements within normal limits will improve Outcome: Progressing Goal: Will remain free from infection Outcome: Progressing Goal: Diagnostic test results will improve Outcome: Progressing Goal: Respiratory complications will improve Outcome: Progressing Goal: Cardiovascular complication will be avoided Outcome: Progressing   Problem: Activity: Goal: Risk for activity intolerance will decrease Outcome: Progressing   Problem: Nutrition: Goal: Adequate nutrition will be maintained Outcome: Progressing   Problem: Coping: Goal: Level of anxiety will decrease Outcome: Progressing   Problem: Elimination: Goal: Will not experience complications related to bowel motility Outcome: Progressing Goal: Will not experience complications related to urinary retention Outcome: Progressing   Problem: Pain Managment: Goal: General experience of comfort will improve Outcome: Progressing   Problem: Safety: Goal: Ability to remain free from injury will improve Outcome: Progressing   Problem: Skin Integrity: Goal:  Risk for impaired skin integrity will decrease Outcome: Progressing   

## 2019-03-08 NOTE — Progress Notes (Signed)
Patient vomited large amount of fluid. PRN zofran given.

## 2019-03-08 NOTE — Progress Notes (Signed)
Nutrition Follow-up  DOCUMENTATION CODES:   Not applicable  INTERVENTION:   -RD will follow for diet advancement and supplement as appropriate -MVI with minerals daily  NUTRITION DIAGNOSIS:   Increased nutrient needs related to post-op healing as evidenced by estimated needs.  Ongoing  GOAL:   Patient will meet greater than or equal to 90% of their needs  Progressing  MONITOR:   Diet advancement, Labs, Weight trends, Skin, I & O's  REASON FOR ASSESSMENT:   Malnutrition Screening Tool    ASSESSMENT:   [place] [facility] [place] secondary to abdominal pain.  He presents to the emergency room this morning with progressive lower abdominal pain.  The pain service 2 weeks ago.  Is been associated with diarrhea.  There is been no blood in his stool.  He has no medical problems and takes no chronic medications.  CT scan showed perforation of his distal descending colon or sigmoid colon.  There is free air of through the umbilicus.  He was extremely tender in his lower abdomen.  He also had an elevated white count of 15,000 and a fever to 101.  He denies any recent history of cough, shortness of breath or other complicating issues.  4/19- s/p Procedure: Exploratory laparotomy with resection of distal descending colon and drainage of large intra-abdominal abscess with end colostomy  Reviewed I/O's: +3.6 L x 24 hours and +7.7 L since admisison  UOP: 450 ml x 24 hours  Emesis: 1 L x 24 hours  Drains: 265 ml x 24 hours  Per RN notes, pt vomited a lot of fluid and was given zofran. General surgery notes reveal plan for advancement to clear liquids, however, pt still NPO with sips and chips.   RD attempted to speak with pt and perform exam, however, pt receiving nursing care at times of visits.   Labs reviewed.   Diet Order:   Diet Order            Diet NPO time specified Except for: Ice Chips, Sips with Meds, Other (See Comments)  Diet  effective now              EDUCATION NEEDS:   No education needs have been identified at this time  Skin:  Skin Assessment: Skin Integrity Issues: Skin Integrity Issues:: Incisions Incisions: closed abdomen  Last BM:  03/07/19 (10 ml output via colostomy)  Height:   Ht Readings from Last 1 Encounters:  03/05/19 5\' 10"  (1.778 m)    Weight:   Wt Readings from Last 1 Encounters:  03/05/19 64.9 kg    Ideal Body Weight:  75.5 kg  BMI:  Body mass index is 20.52 kg/m.  Estimated Nutritional Needs:   Kcal:  1850-2050  Protein:  105-120 grams  Fluid:  > 1.8 L    Kyle Moss A. Mayford Knife, RD, LDN, CDCES Registered Dietitian II Certified Diabetes Care and Education Specialist Pager: [phone] After hours Pager: [phone]

## 2019-03-08 NOTE — Progress Notes (Signed)
Pt c/o nausea persists. Dr. made aware Dr. Jacqulyn Ducking. returned call. On call for Dr. Corliss Skains.

## 2019-03-08 NOTE — TOC Progression Note (Signed)
Transition of Care [facility]) - Progression Note    Patient Details  Name: Kyle Moss MRN: [id number] Date of Birth: 12-Nov-1986  Transition of Care [facility]) CM/SW Contact  Nadene Rubins Adria Devon, RN Phone Number: 03/08/2019, 12:27 PM  Clinical Narrative:     Parent from home with parents. Provided Medicare.gov list . Patient would like [facility]. Patient aware he and family will be educated on wound care prior to discharge.  Expected Discharge Plan: Home w Home Health Services Barriers to Discharge: Continued Medical Work up  Expected Discharge Plan and Services Expected Discharge Plan: Home w Home Health Services   Discharge Planning Services: CM Consult Post Acute Care Choice: Home Health Living arrangements for the past 2 months: Single Family Home                   DME Agency: NA HH Arranged: RN HH Agency: [facility]   Social Determinants of Health (SDOH) Interventions    Readmission Risk Interventions No flowsheet data found.

## 2019-03-09 LAB — BASIC METABOLIC PANEL
Anion gap: 7 (ref 5–15)
BUN: 7 mg/dL (ref 6–20)
CO2: 22 mmol/L (ref 22–32)
Calcium: 7.9 mg/dL — ABNORMAL LOW (ref 8.9–10.3)
Chloride: 116 mmol/L — ABNORMAL HIGH (ref 98–111)
Creatinine, Ser: 0.89 mg/dL (ref 0.61–1.24)
GFR calc Af Amer: >60 mL/min (ref >60)
GFR calc non Af Amer: >60 mL/min (ref >60)
Glucose, Bld: 126 mg/dL — ABNORMAL HIGH (ref 70–99)
Potassium: 3.8 mmol/L (ref 3.5–5.1)
Sodium: 145 mmol/L (ref 135–145)

## 2019-03-09 LAB — CBC
HCT: 27.3 % — ABNORMAL LOW (ref 39.0–52.0)
Hemoglobin: 8.4 g/dL — ABNORMAL LOW (ref 13.0–17.0)
MCH: 26.5 pg (ref 26.0–34.0)
MCHC: 30.8 g/dL (ref 30.0–36.0)
MCV: 86.1 fL (ref 80.0–100.0)
Platelets: 456 10*3/uL — ABNORMAL HIGH (ref 150–400)
RBC: 3.17 MIL/uL — ABNORMAL LOW (ref 4.22–5.81)
RDW: 13.9 % (ref 11.5–15.5)
WBC: 10.3 10*3/uL (ref 4.0–10.5)
nRBC: 0 % (ref 0.0–0.2)

## 2019-03-09 MED ORDER — PIPERACILLIN-TAZOBACTAM 3.375 G IVPB
3.3750 g | Freq: Three times a day (TID) | INTRAVENOUS | Status: DC
  Administered 2019-03-09 – 2019-03-13 (×13): 3.375 g via INTRAVENOUS
  Filled 2019-03-09 (×12): qty 50

## 2019-03-09 MED ORDER — CALCIUM GLUCONATE-NACL 1-0.675 GM/50ML-% IV SOLN
1.0000 g | Freq: Once | INTRAVENOUS | Status: AC
  Administered 2019-03-09: 1000 mg via INTRAVENOUS
  Filled 2019-03-09: qty 50

## 2019-03-09 MED ORDER — METOCLOPRAMIDE HCL 5 MG/ML IJ SOLN
5.0000 mg | Freq: Three times a day (TID) | INTRAMUSCULAR | Status: DC
  Administered 2019-03-09 – 2019-03-13 (×11): 5 mg via INTRAVENOUS
  Filled 2019-03-09 (×12): qty 2

## 2019-03-09 MED ORDER — PROMETHAZINE HCL 25 MG/ML IJ SOLN: 6.2500 mg | Freq: Four times a day (QID) | INTRAMUSCULAR | Status: DC | PRN

## 2019-03-09 MED ORDER — SODIUM CHLORIDE 0.9 % IV SOLN
25.0000 mg | Freq: Three times a day (TID) | INTRAVENOUS | Status: DC | PRN
  Filled 2019-03-09: qty 1

## 2019-03-09 MED ORDER — PROCHLORPERAZINE EDISYLATE 10 MG/2ML IJ SOLN
10.0000 mg | Freq: Four times a day (QID) | INTRAMUSCULAR | Status: DC | PRN
  Administered 2019-03-09: 18:00:00 10 mg via INTRAVENOUS
  Filled 2019-03-09: qty 2

## 2019-03-09 MED ORDER — DOCUSATE SODIUM 100 MG PO CAPS
100.0000 mg | ORAL_CAPSULE | Freq: Two times a day (BID) | ORAL | Status: DC
  Administered 2019-03-10 – 2019-03-13 (×6): 100 mg via ORAL
  Filled 2019-03-09 (×8): qty 1

## 2019-03-09 NOTE — Progress Notes (Signed)
Patient continues to be nauseous and vomiting with PRN compazine 10 mg and reglan 5 mg.

## 2019-03-09 NOTE — Plan of Care (Signed)
  Problem: Education: Goal: Ability to identify and develop effective coping behavior will improve Outcome: Progressing   Problem: Health Behavior/Discharge Planning: Goal: Identification of resources available to assist in meeting health care needs will improve Outcome: Progressing   Problem: Self-Concept: Goal: Expressions of positive opinion of body image will increase Outcome: Progressing Goal: Will verbalize positive feelings about self Outcome: Progressing   Problem: Education: Goal: Knowledge of General Education information will improve Description Including pain rating scale, medication(s)/side effects and non-pharmacologic comfort measures Outcome: Progressing   Problem: Health Behavior/Discharge Planning: Goal: Ability to manage health-related needs will improve Outcome: Progressing   Problem: Clinical Measurements: Goal: Ability to maintain clinical measurements within normal limits will improve Outcome: Progressing Goal: Will remain free from infection Outcome: Progressing Goal: Diagnostic test results will improve Outcome: Progressing Goal: Respiratory complications will improve Outcome: Progressing Goal: Cardiovascular complication will be avoided Outcome: Progressing   Problem: Activity: Goal: Risk for activity intolerance will decrease Outcome: Progressing   Problem: Nutrition: Goal: Adequate nutrition will be maintained Outcome: Progressing   Problem: Coping: Goal: Level of anxiety will decrease Outcome: Progressing   Problem: Elimination: Goal: Will not experience complications related to bowel motility Outcome: Progressing Goal: Will not experience complications related to urinary retention Outcome: Progressing   Problem: Pain Managment: Goal: General experience of comfort will improve Outcome: Progressing   Problem: Safety: Goal: Ability to remain free from injury will improve Outcome: Progressing   Problem: Skin Integrity: Goal:  Risk for impaired skin integrity will decrease Outcome: Progressing

## 2019-03-09 NOTE — Anesthesia Postprocedure Evaluation (Signed)
Anesthesia Post Note  Patient: Stage manager  Procedure(s) Performed: EXPLORATORY LAPAROTOMY (N/A )     Patient location during evaluation: PACU Anesthesia Type: General Level of consciousness: awake and alert Pain management: pain level controlled Vital Signs Assessment: post-procedure vital signs reviewed and stable Respiratory status: spontaneous breathing, nonlabored ventilation, respiratory function stable and patient connected to nasal cannula oxygen Cardiovascular status: stable Postop Assessment: no apparent nausea or vomiting Anesthetic complications: no    Last Vitals:  Vitals:   03/09/19 1358 03/09/19 2109  BP: 122/74 117/77  Pulse: 62 65  Resp: 16 18  Temp: 36.7 C 37.1 C  SpO2: 98% 97%    Last Pain:  Vitals:   03/09/19 2109  TempSrc: Oral  PainSc:                  Kyle Moss

## 2019-03-09 NOTE — Progress Notes (Signed)
Nutrition Follow-up  RD working remotely.  DOCUMENTATION CODES:   Not applicable  INTERVENTION:   -RD will follow for diet advancement and supplement as appropriate -Continue MVI with minerals daily -If prolonged NPO/ clear liquid status is anticipated, consider initiation of nutrition support  NUTRITION DIAGNOSIS:   Increased nutrient needs related to post-op healing as evidenced by estimated needs.  Ongoing  GOAL:   Patient will meet greater than or equal to 90% of their needs  Unmet  MONITOR:   Diet advancement, Labs, Weight trends, Skin, I & O's  REASON FOR ASSESSMENT:   Malnutrition Screening Tool    ASSESSMENT:   [place] [facility] [place] secondary to abdominal pain.  He presents to the emergency room this morning with progressive lower abdominal pain.  The pain service 2 weeks ago.  Is been associated with diarrhea.  There is been no blood in his stool.  He has no medical problems and takes no chronic medications.  CT scan showed perforation of his distal descending colon or sigmoid colon.  There is free air of through the umbilicus.  He was extremely tender in his lower abdomen.  He also had an elevated white count of 15,000 and a fever to 101.  He denies any recent history of cough, shortness of breath or other complicating issues.  4/19- s/pProcedure: Exploratorylaparotomy with resection of distal descending colon and drainage of large intra-abdominal abscess with end colostomy  Reviewed I/O's: -330 ml x 24 hours and +7.3 L since admission  UOP: 250 ml x 24 hours  Drains: 260 ml x 24 hours  Colostomy output: 300 ml x 24 hours  Pt with multiple instance of nausea and vomiting of the past 24 hours. He is still receiving only clear liquids form the floor. Per general surgery notes, if no improvement by tomorrow, CT scan will be obtained to rule out abscess.   Labs reviewed.   Diet Order:   Diet Order            Diet  NPO time specified Except for: Ice Chips, Sips with Meds, Other (See Comments)  Diet effective now              EDUCATION NEEDS:   No education needs have been identified at this time  Skin:  Skin Assessment: Skin Integrity Issues: Skin Integrity Issues:: Incisions Incisions: closed abdomen  Last BM:  03/07/19 (10 ml output via colostomy)  Height:   Ht Readings from Last 1 Encounters:  03/05/19 5\' 10"  (1.778 m)    Weight:   Wt Readings from Last 1 Encounters:  03/05/19 64.9 kg    Ideal Body Weight:  75.5 kg  BMI:  Body mass index is 20.52 kg/m.  Estimated Nutritional Needs:   Kcal:  1850-2050  Protein:  105-120 grams  Fluid:  > 1.8 L    Neveen Daponte A. Mayford Knife, RD, LDN, CDCES Registered Dietitian II Certified Diabetes Care and Education Specialist Pager: [phone] After hours Pager: [phone]

## 2019-03-09 NOTE — Progress Notes (Signed)
Patient belching and nauseous this morning. Stated no relief with zofran po or IV. Gave prn phenergan. Colostomy bag full of 250 cc yellow tint fluid. J/P 50 cc yellow fluid.

## 2019-03-09 NOTE — Progress Notes (Signed)
[facility] Progress Note  4 Days Post-Op  Subjective: CC: nausea and emesis Patient still having a lot of nausea and having some bilious emesis. Still passing liquid stool from stoma. Patient has been ambulating. Pain well controlled.   Objective: Vital signs in last 24 hours: Temp:  [98.1 F (36.7 C)-98.4 F (36.9 C)] 98.1 F (36.7 C) (04/23 0455) Pulse Rate:  [74-78] 74 (04/23 0455) Resp:  [18-20] 20 (04/23 0455) BP: (113-123)/(70-74) 123/70 (04/23 0455) SpO2:  [97 %-100 %] 100 % (04/23 0455) Last BM Date: 03/09/19  Intake/Output from previous day: 04/22 0701 - 04/23 0700 In: 480 [P.O.:480] Out: 810 [Urine:250; Drains:260; Stool:300] Intake/Output this shift: Total I/O In: 0  Out: 300 [Drains:50; Stool:250]  PE: Gen:  Alert, NAD, pleasant Card:  Regular rate and rhythm Pulm:  Normal effort, clear to auscultation bilaterally Abd: Soft, mildly TTP along incision, no distention, +BS, midline wound clean with good granulation, drain in RLQ with mostly SS fluid - maybe a little cloudier than previously, stoma working and clear greenish stool present Skin: warm and dry, no rashes  Psych: A&Ox3   Lab Results:  Recent Labs    03/07/19 0126 03/08/19 0015  WBC 20.6* 16.1*  HGB 9.1* 8.5*  HCT 28.2* 27.1*  PLT 417* 437*   BMET Recent Labs    03/07/19 0126 03/08/19 0227  NA 139 142  K 4.1 3.9  CL 110 112*  CO2 21* 22  GLUCOSE 161* 136*  BUN 9 9  CREATININE 0.88 0.86  CALCIUM 7.9* 7.6*   PT/INR No results for input(s): LABPROT, INR in the last 72 hours. CMP     Component Value Date/Time   NA 142 03/08/2019 0227   K 3.9 03/08/2019 0227   CL 112 (H) 03/08/2019 0227   CO2 22 03/08/2019 0227   GLUCOSE 136 (H) 03/08/2019 0227   BUN 9 03/08/2019 0227   CREATININE 0.86 03/08/2019 0227   CALCIUM 7.6 (L) 03/08/2019 0227   PROT 6.1 (L) 03/06/2019 0701   ALBUMIN 2.3 (L) 03/06/2019 0701   AST 14 (L) 03/06/2019 0701   ALT 12 03/06/2019 0701   ALKPHOS  48 03/06/2019 0701   BILITOT 0.7 03/06/2019 0701   GFRNONAA >60 03/08/2019 0227   GFRAA >60 03/08/2019 0227   Lipase     Component Value Date/Time   LIPASE 17 03/05/2019 1431       Studies/Results: No results found.  Anti-infectives: Anti-infectives (From admission, onward)   Start     Dose/Rate Route Frequency Ordered Stop   03/06/19 1600  cefTRIAXone (ROCEPHIN) 2 g in sodium chloride 0.9 % 100 mL IVPB     2 g 200 mL/hr over 30 Minutes Intravenous Every 24 hours 03/05/19 2158     03/06/19 0600  cefTRIAXone (ROCEPHIN) 2 g in sodium chloride 0.9 % 100 mL IVPB  Status:  Discontinued     2 g 200 mL/hr over 30 Minutes Intravenous On call to O.R. 03/05/19 2326 03/05/19 2329   03/05/19 2326  metroNIDAZOLE (FLAGYL) IVPB 500 mg  Status:  Discontinued     500 mg 100 mL/hr over 60 Minutes Intravenous Every 8 hours 03/05/19 2326 03/05/19 2329   03/05/19 2158  metroNIDAZOLE (FLAGYL) IVPB 500 mg     500 mg 100 mL/hr over 60 Minutes Intravenous Every 8 hours 03/05/19 2158     03/05/19 1615  cefTRIAXone (ROCEPHIN) 2 g in sodium chloride 0.9 % 100 mL IVPB     2 g 200 mL/hr over 30  Minutes Intravenous  Once 03/05/19 1613 03/05/19 1728   03/05/19 1615  metroNIDAZOLE (FLAGYL) IVPB 500 mg     500 mg 100 mL/hr over 60 Minutes Intravenous  Once 03/05/19 1613 03/05/19 1817       Assessment/Plan Perforation of distal descending colon with large intra-abdominal abscess - s/p exploratory laparotomy with Hartman's procedure 03/05/19 Dr. Luisa Hart - POD#4 - surgical pathology showed diverticulitis - WBC16.1 (4/22), afebrile- change abx to zosyn, as flagyl can sometimes cause nausea - JP with 260 out in 24h, continue to monitor - continue clear liquids - mobilize - WOC following for new colostomy  AKI- resolved  FEN: clear liquids, IVF VTE: SCDs, lovenox ID: rocephin/flagyl 4/19>4/23; Zosyn 4/23>>  Continue clears, recheck labs. May need abd film vs CT if not improving. Try changing  abx and see if zosyn is better tolerated and added scheduled reglan and prn compazine.   LOS: 4 days    Wells Guiles , [facility] 03/09/2019, 11:24 AM Pager: [phone] Consults: [phone]

## 2019-03-10 ENCOUNTER — Inpatient Hospital Stay (HOSPITAL_COMMUNITY): Payer: BLUE CROSS/BLUE SHIELD

## 2019-03-10 LAB — CBC
HCT: 24.8 % — ABNORMAL LOW (ref 39.0–52.0)
Hemoglobin: 7.8 g/dL — ABNORMAL LOW (ref 13.0–17.0)
MCH: 27.3 pg (ref 26.0–34.0)
MCHC: 31.5 g/dL (ref 30.0–36.0)
MCV: 86.7 fL (ref 80.0–100.0)
Platelets: 435 10*3/uL — ABNORMAL HIGH (ref 150–400)
RBC: 2.86 MIL/uL — ABNORMAL LOW (ref 4.22–5.81)
RDW: 13.8 % (ref 11.5–15.5)
WBC: 11.6 10*3/uL — ABNORMAL HIGH (ref 4.0–10.5)
nRBC: 0 % (ref 0.0–0.2)

## 2019-03-10 LAB — BASIC METABOLIC PANEL
Anion gap: 10 (ref 5–15)
BUN: 8 mg/dL (ref 6–20)
CO2: 20 mmol/L — ABNORMAL LOW (ref 22–32)
Calcium: 7.8 mg/dL — ABNORMAL LOW (ref 8.9–10.3)
Chloride: 114 mmol/L — ABNORMAL HIGH (ref 98–111)
Creatinine, Ser: 0.99 mg/dL (ref 0.61–1.24)
GFR calc Af Amer: >60 mL/min (ref >60)
GFR calc non Af Amer: >60 mL/min (ref >60)
Glucose, Bld: 95 mg/dL (ref 70–99)
Potassium: 3.7 mmol/L (ref 3.5–5.1)
Sodium: 144 mmol/L (ref 135–145)

## 2019-03-10 LAB — CULTURE, BLOOD (ROUTINE X 2)
Culture: NO GROWTH
Culture: NO GROWTH
Special Requests: ADEQUATE

## 2019-03-10 MED ORDER — SODIUM CHLORIDE 0.9% FLUSH
10.0000 mL | Freq: Two times a day (BID) | INTRAVENOUS | Status: DC
  Administered 2019-03-10 – 2019-03-12 (×3): 10 mL

## 2019-03-10 MED ORDER — CALCIUM CARBONATE ANTACID 500 MG PO CHEW
200.0000 mg | CHEWABLE_TABLET | Freq: Three times a day (TID) | ORAL | Status: AC
  Administered 2019-03-10 – 2019-03-11 (×5): 200 mg via ORAL
  Filled 2019-03-10 (×5): qty 1

## 2019-03-10 MED ORDER — BOOST / RESOURCE BREEZE PO LIQD CUSTOM
1.0000 | Freq: Three times a day (TID) | ORAL | Status: DC
  Administered 2019-03-10 – 2019-03-11 (×4): 1 via ORAL

## 2019-03-10 MED ORDER — IOPAMIDOL (ISOVUE-300) INJECTION 61%
100.0000 mL | Freq: Once | INTRAVENOUS | Status: AC | PRN
  Administered 2019-03-10: 14:00:00 100 mL via INTRAVENOUS

## 2019-03-10 MED ORDER — PROCHLORPERAZINE EDISYLATE 10 MG/2ML IJ SOLN
10.0000 mg | Freq: Four times a day (QID) | INTRAMUSCULAR | Status: DC | PRN
  Administered 2019-03-10 (×2): 10 mg via INTRAVENOUS
  Filled 2019-03-10 (×2): qty 2

## 2019-03-10 MED ORDER — SODIUM CHLORIDE 0.9% FLUSH: 10.0000 mL | INTRAVENOUS | Status: DC | PRN

## 2019-03-10 NOTE — Progress Notes (Signed)
[facility] Progress Note  5 Days Post-Op  Subjective: CC: diverticulitis Patient nausea improved this AM and he is hoping to mobilize some this AM. Having liquid stool output. Denies abdominal pain. Dressing changed at 0600 this AM.  Objective: Vital signs in last 24 hours: Temp:  [98.1 F (36.7 C)-98.8 F (37.1 C)] 98.1 F (36.7 C) (04/24 0534) Pulse Rate:  [60-65] 60 (04/24 0534) Resp:  [16-18] 17 (04/24 0534) BP: (117-125)/(74-77) 125/77 (04/24 0534) SpO2:  [97 %-99 %] 99 % (04/24 0534) Last BM Date: 03/09/19  Intake/Output from previous day: 04/23 0701 - 04/24 0700 In: 0  Out: 1755 [Urine:750; Drains:205; Stool:800] Intake/Output this shift: No intake/output data recorded.  PE: Gen:  Alert, NAD, pleasant Card:  Regular rate and rhythm Pulm:  Normal effort, clear to auscultation bilaterally Abd: Soft, mildly TTP along incision, no distention, +BS, midline wound clean with good granulation, drain in RLQ with mostly SS fluid - looking a little more purulent, stoma working and liquid stool present Skin: warm and dry, no rashes  Psych: A&Ox3   Lab Results:  Recent Labs    03/09/19 1151 03/10/19 0122  WBC 10.3 11.6*  HGB 8.4* 7.8*  HCT 27.3* 24.8*  PLT 456* 435*   BMET Recent Labs    03/09/19 1152 03/10/19 0122  NA 145 144  K 3.8 3.7  CL 116* 114*  CO2 22 20*  GLUCOSE 126* 95  BUN 7 8  CREATININE 0.89 0.99  CALCIUM 7.9* 7.8*   PT/INR No results for input(s): LABPROT, INR in the last 72 hours. CMP     Component Value Date/Time   NA 144 03/10/2019 0122   K 3.7 03/10/2019 0122   CL 114 (H) 03/10/2019 0122   CO2 20 (L) 03/10/2019 0122   GLUCOSE 95 03/10/2019 0122   BUN 8 03/10/2019 0122   CREATININE 0.99 03/10/2019 0122   CALCIUM 7.8 (L) 03/10/2019 0122   PROT 6.1 (L) 03/06/2019 0701   ALBUMIN 2.3 (L) 03/06/2019 0701   AST 14 (L) 03/06/2019 0701   ALT 12 03/06/2019 0701   ALKPHOS 48 03/06/2019 0701   BILITOT 0.7 03/06/2019 0701   GFRNONAA >60 03/10/2019 0122   GFRAA >60 03/10/2019 0122   Lipase     Component Value Date/Time   LIPASE 17 03/05/2019 1431       Studies/Results: No results found.  Anti-infectives: Anti-infectives (From admission, onward)   Start     Dose/Rate Route Frequency Ordered Stop   03/09/19 1400  piperacillin-tazobactam (ZOSYN) IVPB 3.375 g     3.375 g 12.5 mL/hr over 240 Minutes Intravenous Every 8 hours 03/09/19 1141     03/06/19 1600  cefTRIAXone (ROCEPHIN) 2 g in sodium chloride 0.9 % 100 mL IVPB  Status:  Discontinued     2 g 200 mL/hr over 30 Minutes Intravenous Every 24 hours 03/05/19 2158 03/09/19 1141   03/06/19 0600  cefTRIAXone (ROCEPHIN) 2 g in sodium chloride 0.9 % 100 mL IVPB  Status:  Discontinued     2 g 200 mL/hr over 30 Minutes Intravenous On call to O.R. 03/05/19 2326 03/05/19 2329   03/05/19 2326  metroNIDAZOLE (FLAGYL) IVPB 500 mg  Status:  Discontinued     500 mg 100 mL/hr over 60 Minutes Intravenous Every 8 hours 03/05/19 2326 03/05/19 2329   03/05/19 2158  metroNIDAZOLE (FLAGYL) IVPB 500 mg  Status:  Discontinued     500 mg 100 mL/hr over 60 Minutes Intravenous Every 8 hours 03/05/19 2158 03/09/19  1141   03/05/19 1615  cefTRIAXone (ROCEPHIN) 2 g in sodium chloride 0.9 % 100 mL IVPB     2 g 200 mL/hr over 30 Minutes Intravenous  Once 03/05/19 1613 03/05/19 1728   03/05/19 1615  metroNIDAZOLE (FLAGYL) IVPB 500 mg     500 mg 100 mL/hr over 60 Minutes Intravenous  Once 03/05/19 1613 03/05/19 1817       Assessment/Plan Perforation of distal descending colon with large intra-abdominal abscess - s/p exploratory laparotomy with Hartman's procedure 03/05/19 Dr. Luisa Hartornett - POD#5 - surgical pathology showed diverticulitis - ZOX09.6,[id number],afebrile- continue zosyn - JP with 205 out in 24h, looking a little more purulent - continue clear liquids - mobilize - WOC following for new colostomy  AKI- resolved  [id number]:clear liquids, IVF VTE: SCDs, lovenox ID:  rocephin/flagyl 4/19>4/23; Zosyn 4/23>>  CT abd pelvis today with purulence from drain and slight leukocytosis.   LOS: 5 days    Wells GuilesKelly Rayburn , [facility]A-C [facility] 03/10/2019, 7:54 AM Pager: [phone] Consults: [phone]

## 2019-03-10 NOTE — Progress Notes (Signed)
Nutrition Follow-up  RD working remotely.  DOCUMENTATION CODES:   Not applicable  INTERVENTION:   -Continue MVI with minerals daily -Boost Breeze po TID, each supplement provides 250 kcal and 9 grams of protein -If prolonged NPO/clear liquid diet status is anticipated, consider initiation of nutrition support  NUTRITION DIAGNOSIS:   Increased nutrient needs related to post-op healing as evidenced by estimated needs.  Ongoing  GOAL:   Patient will meet greater than or equal to 90% of their needs  Progressing  MONITOR:   Diet advancement, Labs, Weight trends, Skin, I & O's  REASON FOR ASSESSMENT:   Malnutrition Screening Tool    ASSESSMENT:   [place] [facility] [place] secondary to abdominal pain.  He presents to the emergency room this morning with progressive lower abdominal pain.  The pain service 2 weeks ago.  Is been associated with diarrhea.  There is been no blood in his stool.  He has no medical problems and takes no chronic medications.  CT scan showed perforation of his distal descending colon or sigmoid colon.  There is free air of through the umbilicus.  He was extremely tender in his lower abdomen.  He also had an elevated white count of 15,000 and a fever to 101.  He denies any recent history of cough, shortness of breath or other complicating issues.  4/19- s/pProcedure: Exploratorylaparotomy with resection of distal descending colon and drainage of large intra-abdominal abscess with end colostomy 4/23- reglan added 4/24- advanced to clear liquid diet  Reviewed I/O's: -1.8 L x 24 hours and +5.6 L since admission  UOP: 750 ml x 24 hours  Drain output: 205 ml x 24 hours  Colostomy output: 800 ml x 24 hours  Pt's nausea and vomiting have improved. Noted meal completion 0%. Plan for CT of abdomen and pelvis today to rule out abscess. General surgery notes reveal purulence from JP drain and elevated WBC.   Pt with  inadequate nutrition since hospital stay (5 days) due to NPO/clear liquid diet status. RD will add Boost Breeze supplement for additional calories and protein. If NPO/Clear liquid diet status is anticipated, consider initiation of nutrition support.   Labs reviewed.   Diet Order:   Diet Order            Diet clear liquid Room service appropriate? Yes; Fluid consistency: Thin  Diet effective now              EDUCATION NEEDS:   No education needs have been identified at this time  Skin:  Skin Assessment: Skin Integrity Issues: Skin Integrity Issues:: Incisions Incisions: closed abdomen  Last BM:  03/10/19 (100 ml output via colostomy)  Height:   Ht Readings from Last 1 Encounters:  03/05/19 5\' 10"  (1.778 m)    Weight:   Wt Readings from Last 1 Encounters:  03/05/19 64.9 kg    Ideal Body Weight:  75.5 kg  BMI:  Body mass index is 20.52 kg/m.  Estimated Nutritional Needs:   Kcal:  1850-2050  Protein:  105-120 grams  Fluid:  > 1.8 L    Alyzza Andringa A. Mayford Knife, RD, LDN, CDCES Registered Dietitian II Certified Diabetes Care and Education Specialist Pager: [phone] After hours Pager: [phone]

## 2019-03-10 NOTE — Progress Notes (Signed)
When patient eats: have to reinforce to not force vomiting or gag reflex. Having to educate patient to allow food to settle, may feel nauseous but do not force vomiting. Witnessed patient trying to force vomiting x2, will continue to reinforce and educate.

## 2019-03-11 LAB — BASIC METABOLIC PANEL
Anion gap: 8 (ref 5–15)
BUN: 5 mg/dL — ABNORMAL LOW (ref 6–20)
CO2: 23 mmol/L (ref 22–32)
Calcium: 7.7 mg/dL — ABNORMAL LOW (ref 8.9–10.3)
Chloride: 112 mmol/L — ABNORMAL HIGH (ref 98–111)
Creatinine, Ser: 0.89 mg/dL (ref 0.61–1.24)
GFR calc Af Amer: >60 mL/min (ref >60)
GFR calc non Af Amer: >60 mL/min (ref >60)
Glucose, Bld: 128 mg/dL — ABNORMAL HIGH (ref 70–99)
Potassium: 3.2 mmol/L — ABNORMAL LOW (ref 3.5–5.1)
Sodium: 143 mmol/L (ref 135–145)

## 2019-03-11 LAB — CBC
HCT: 23.3 % — ABNORMAL LOW (ref 39.0–52.0)
Hemoglobin: 7.1 g/dL — ABNORMAL LOW (ref 13.0–17.0)
MCH: 26.5 pg (ref 26.0–34.0)
MCHC: 30.5 g/dL (ref 30.0–36.0)
MCV: 86.9 fL (ref 80.0–100.0)
Platelets: 422 10*3/uL — ABNORMAL HIGH (ref 150–400)
RBC: 2.68 MIL/uL — ABNORMAL LOW (ref 4.22–5.81)
RDW: 14.1 % (ref 11.5–15.5)
WBC: 9.3 10*3/uL (ref 4.0–10.5)
nRBC: 0 % (ref 0.0–0.2)

## 2019-03-11 MED ORDER — KCL IN DEXTROSE-NACL 20-5-0.45 MEQ/L-%-% IV SOLN: INTRAVENOUS | Status: DC

## 2019-03-11 MED ORDER — POTASSIUM CHLORIDE 10 MEQ/100ML IV SOLN
10.0000 meq | INTRAVENOUS | Status: AC
  Administered 2019-03-11 (×3): 10 meq via INTRAVENOUS
  Filled 2019-03-11 (×3): qty 100

## 2019-03-11 MED ORDER — ZOLPIDEM TARTRATE 5 MG PO TABS
5.0000 mg | ORAL_TABLET | Freq: Once | ORAL | Status: AC
  Administered 2019-03-11: 21:00:00 5 mg via ORAL
  Filled 2019-03-11: qty 1

## 2019-03-11 MED ORDER — PANTOPRAZOLE SODIUM 40 MG IV SOLR
40.0000 mg | Freq: Every day | INTRAVENOUS | Status: DC
  Administered 2019-03-11: 22:00:00 40 mg via INTRAVENOUS
  Filled 2019-03-11: qty 40

## 2019-03-11 MED ORDER — ENSURE ENLIVE PO LIQD
237.0000 mL | Freq: Two times a day (BID) | ORAL | Status: DC
  Administered 2019-03-11 – 2019-03-13 (×5): 237 mL via ORAL

## 2019-03-11 MED ORDER — PANTOPRAZOLE SODIUM 40 MG PO TBEC: 40.0000 mg | DELAYED_RELEASE_TABLET | Freq: Every day | ORAL | Status: DC

## 2019-03-11 NOTE — Progress Notes (Signed)
Progress Note: General Surgery Service   Assessment/Plan: Active Problems:   Colon perforation (HCC)  s/p Procedure(s): EXPLORATORY LAPAROTOMY 03/05/2019  Perforation of distal descending colon with large intra-abdominal abscess - s/p exploratory laparotomy with Hartman's procedure 03/05/19 Dr. Luisa Hart - POD#5 - surgical pathology showed diverticulitis - WBC16.1 (4/22),afebrile- change abx to zosyn, as flagyl can sometimes cause nausea - advance to full liquids - mobilize - WOC following for new colostomy  AKI- resolved  PHK:FEXM liquids, IVF VTE: SCDs, lovenox ID: rocephin/flagyl 4/19>4/23; Zosyn 4/23>>  Continue clears, recheck labs. May need abd film vs CT if not improving. Try changing abx and see if zosyn is better tolerated and added scheduled reglan and prn compazine.     LOS: 6 days  Chief Complaint/Subjective: Some nausea this morning, tolerating ice and boost  Objective: Vital signs in last 24 hours: Temp:  [98.2 F (36.8 C)-98.3 F (36.8 C)] 98.2 F (36.8 C) (04/25 0613) Pulse Rate:  [57-74] 63 (04/25 0613) Resp:  [17-18] 17 (04/25 0613) BP: (123-133)/(74-75) 130/75 (04/25 0613) SpO2:  [98 %] 98 % (04/25 1470) Last BM Date: 03/05/19  Intake/Output from previous day: 04/24 0701 - 04/25 0700 In: 278.2 [P.O.:110; IV Piggyback:168.2] Out: 1020 [Urine:225; Emesis/NG output:400; Drains:95; Stool:300] Intake/Output this shift: Total I/O In: -  Out: 205 [Urine:175; Drains:30]  Lungs: nonlabored  Cardiovascular: RRR  Abd: soft, open incision with clean base, ostomy with gas and stool in bag  Extremities: no edema  Neuro: AOx4  Lab Results: CBC  Recent Labs    03/10/19 0122 03/11/19 0324  WBC 11.6* 9.3  HGB 7.8* 7.1*  HCT 24.8* 23.3*  PLT 435* 422*   BMET Recent Labs    03/10/19 0122 03/11/19 0324  NA 144 143  K 3.7 3.2*  CL 114* 112*  CO2 20* 23  GLUCOSE 95 128*  BUN 8 5*  CREATININE 0.99 0.89  CALCIUM 7.8* 7.7*   PT/INR  No results for input(s): LABPROT, INR in the last 72 hours. ABG No results for input(s): PHART, HCO3 in the last 72 hours.  Invalid input(s): PCO2, PO2  Studies/Results:  Anti-infectives: Anti-infectives (From admission, onward)   Start     Dose/Rate Route Frequency Ordered Stop   03/09/19 1400  piperacillin-tazobactam (ZOSYN) IVPB 3.375 g     3.375 g 12.5 mL/hr over 240 Minutes Intravenous Every 8 hours 03/09/19 1141     03/06/19 1600  cefTRIAXone (ROCEPHIN) 2 g in sodium chloride 0.9 % 100 mL IVPB  Status:  Discontinued     2 g 200 mL/hr over 30 Minutes Intravenous Every 24 hours 03/05/19 2158 03/09/19 1141   03/06/19 0600  cefTRIAXone (ROCEPHIN) 2 g in sodium chloride 0.9 % 100 mL IVPB  Status:  Discontinued     2 g 200 mL/hr over 30 Minutes Intravenous On call to O.R. 03/05/19 2326 03/05/19 2329   03/05/19 2326  metroNIDAZOLE (FLAGYL) IVPB 500 mg  Status:  Discontinued     500 mg 100 mL/hr over 60 Minutes Intravenous Every 8 hours 03/05/19 2326 03/05/19 2329   03/05/19 2158  metroNIDAZOLE (FLAGYL) IVPB 500 mg  Status:  Discontinued     500 mg 100 mL/hr over 60 Minutes Intravenous Every 8 hours 03/05/19 2158 03/09/19 1141   03/05/19 1615  cefTRIAXone (ROCEPHIN) 2 g in sodium chloride 0.9 % 100 mL IVPB     2 g 200 mL/hr over 30 Minutes Intravenous  Once 03/05/19 1613 03/05/19 1728   03/05/19 1615  metroNIDAZOLE (FLAGYL) IVPB 500 mg  500 mg 100 mL/hr over 60 Minutes Intravenous  Once 03/05/19 1613 03/05/19 1817      Medications: Scheduled Meds: . calcium carbonate  200 mg of elemental calcium Oral TID WC  . docusate sodium  100 mg Oral BID  . enoxaparin (LOVENOX) injection  40 mg Subcutaneous Q24H  . feeding supplement  1 Container Oral TID BM  . feeding supplement (ENSURE ENLIVE)  237 mL Oral BID BM  . metoCLOPramide (REGLAN) injection  5 mg Intravenous Q8H  . multivitamin with minerals  1 tablet Oral Daily  . pantoprazole (PROTONIX) IV  40 mg Intravenous QHS  .  sodium chloride flush  10-40 mL Intracatheter Q12H   Continuous Infusions: . chlorproMAZINE (THORAZINE) IV    . dextrose 5 % and 0.9% NaCl 125 mL/hr at 03/10/19 1802  . piperacillin-tazobactam (ZOSYN)  IV 3.375 g (03/11/19 0837)   PRN Meds:.acetaminophen **OR** acetaminophen, chlorproMAZINE (THORAZINE) IV, fentaNYL (SUBLIMAZE) injection, hydrALAZINE, [COMPLETED] ketorolac **FOLLOWED BY** ketorolac, methocarbamol, ondansetron **OR** ondansetron (ZOFRAN) IV, prochlorperazine, promethazine, sodium chloride flush  Rodman PickleLuke Aaron Kinsinger, MD [facility]

## 2019-03-11 NOTE — Plan of Care (Signed)
  Problem: Education: Goal: Ability to identify and develop effective coping behavior will improve Outcome: Progressing   Problem: Health Behavior/Discharge Planning: Goal: Identification of resources available to assist in meeting health care needs will improve Outcome: Progressing   Problem: Self-Concept: Goal: Expressions of positive opinion of body image will increase Outcome: Progressing Goal: Will verbalize positive feelings about self Outcome: Progressing   Problem: Education: Goal: Knowledge of General Education information will improve Description Including pain rating scale, medication(s)/side effects and non-pharmacologic comfort measures Outcome: Progressing   Problem: Health Behavior/Discharge Planning: Goal: Ability to manage health-related needs will improve Outcome: Progressing   Problem: Clinical Measurements: Goal: Ability to maintain clinical measurements within normal limits will improve Outcome: Progressing Goal: Will remain free from infection Outcome: Progressing Goal: Diagnostic test results will improve Outcome: Progressing Goal: Respiratory complications will improve Outcome: Progressing Goal: Cardiovascular complication will be avoided Outcome: Progressing   Problem: Activity: Goal: Risk for activity intolerance will decrease Outcome: Progressing   Problem: Nutrition: Goal: Adequate nutrition will be maintained Outcome: Progressing   Problem: Coping: Goal: Level of anxiety will decrease Outcome: Progressing   Problem: Elimination: Goal: Will not experience complications related to bowel motility Outcome: Progressing Goal: Will not experience complications related to urinary retention Outcome: Progressing   Problem: Pain Managment: Goal: General experience of comfort will improve Outcome: Progressing   Problem: Safety: Goal: Ability to remain free from injury will improve Outcome: Progressing   Problem: Skin Integrity: Goal:  Risk for impaired skin integrity will decrease Outcome: Progressing   

## 2019-03-12 LAB — CBC
HCT: 25.5 % — ABNORMAL LOW (ref 39.0–52.0)
Hemoglobin: 7.9 g/dL — ABNORMAL LOW (ref 13.0–17.0)
MCH: 27.1 pg (ref 26.0–34.0)
MCHC: 31 g/dL (ref 30.0–36.0)
MCV: 87.3 fL (ref 80.0–100.0)
Platelets: 461 10*3/uL — ABNORMAL HIGH (ref 150–400)
RBC: 2.92 MIL/uL — ABNORMAL LOW (ref 4.22–5.81)
RDW: 14.2 % (ref 11.5–15.5)
WBC: 9.9 10*3/uL (ref 4.0–10.5)
nRBC: 0 % (ref 0.0–0.2)

## 2019-03-12 MED ORDER — PANTOPRAZOLE SODIUM 40 MG PO TBEC
40.0000 mg | DELAYED_RELEASE_TABLET | Freq: Every day | ORAL | Status: DC
  Filled 2019-03-12: qty 1

## 2019-03-12 MED ORDER — WHITE PETROLATUM EX OINT
TOPICAL_OINTMENT | CUTANEOUS | Status: AC
  Administered 2019-03-12: 0.2
  Filled 2019-03-12: qty 28.35

## 2019-03-12 NOTE — Plan of Care (Signed)
Problem: Education: Goal: Ability to identify and develop effective coping behavior will improve Outcome: Progressing   Problem: Health Behavior/Discharge Planning: Goal: Identification of resources available to assist in meeting health care needs will improve Outcome: Progressing   Problem: Self-Concept: Goal: Expressions of positive opinion of body image will increase Outcome: Progressing   Problem: Education: Goal: Knowledge of General Education information will improve Description Including pain rating scale, medication(s)/side effects and non-pharmacologic comfort measures Outcome: Progressing   Problem: Health Behavior/Discharge Planning: Goal: Ability to manage health-related needs will improve Outcome: Progressing   Problem: Clinical Measurements: Goal: Ability to maintain clinical measurements within normal limits will improve Outcome: Progressing Goal: Respiratory complications will improve Outcome: Progressing Goal: Cardiovascular complication will be avoided Outcome: Progressing   Problem: Nutrition: Goal: Adequate nutrition will be maintained Outcome: Progressing   Problem: Coping: Goal: Level of anxiety will decrease Outcome: Progressing   Problem: Elimination: Goal: Will not experience complications related to urinary retention Outcome: Progressing   Problem: Pain Managment: Goal: General experience of comfort will improve Outcome: Progressing   Problem: Safety: Goal: Ability to remain free from injury will improve Outcome: Progressing   Problem: Skin Integrity: Goal: Risk for impaired skin integrity will decrease Outcome: Progressing

## 2019-03-12 NOTE — Progress Notes (Signed)
Progress Note: General Surgery Service   Assessment/Plan: Active Problems:   Colon perforation (HCC)  s/p Procedure(s): EXPLORATORY LAPAROTOMY 03/05/2019  Perforation of distal descending colon with large intra-abdominal abscess - s/p exploratory laparotomy with Hartman's procedure 03/05/19 Dr. Luisa Hart - POD#6 - surgical pathologyshowed diverticulitis - WBC16.1(4/22),afebrile- change abx to zosyn, as flagyl can sometimes cause nausea -advance to reg diet - mobilize - WOCfollowingfor new colostomy  AKI- resolved  BWG:YKZL liquids, IVF VTE: SCDs, lovenox ID: rocephin/flagyl 4/19>4/23; Zosyn 4/23>>  Doing well, continue ostomy/wound education   LOS: 7 days  Chief Complaint/Subjective: Pain well controlled, tolerated liquids, no nausea, ostomy producing, able to empty bag this morning himself  Objective: Vital signs in last 24 hours: Temp:  [98 F (36.7 C)-98.6 F (37 C)] 98.6 F (37 C) (04/26 0523) Pulse Rate:  [68-84] 68 (04/26 0523) Resp:  [16-17] 16 (04/25 2058) BP: (122-132)/(73-77) 132/77 (04/26 0523) SpO2:  [98 %-99 %] 98 % (04/26 0523) Last BM Date: 03/11/19(thru colostomy bag but very minimal liquid)  Intake/Output from previous day: 04/25 0701 - 04/26 0700 In: 767 [P.O.:237; I.V.:500; IV Piggyback:30] Out: 1465 [Urine:1125; Drains:190; Stool:150] Intake/Output this shift: Total I/O In: 150 [P.O.:150] Out: 600 [Urine:600]  Lungs: nonlabored  Cardiovascular: RRR  Abd: soft, open abdomen with clean base, ostomy pink and patent small amount of leakage  Extremities: no edema  Neuro: AOx4  Lab Results: CBC  Recent Labs    03/11/19 0324 03/12/19 0406  WBC 9.3 9.9  HGB 7.1* 7.9*  HCT 23.3* 25.5*  PLT 422* 461*   BMET Recent Labs    03/10/19 0122 03/11/19 0324  NA 144 143  K 3.7 3.2*  CL 114* 112*  CO2 20* 23  GLUCOSE 95 128*  BUN 8 5*  CREATININE 0.99 0.89  CALCIUM 7.8* 7.7*   PT/INR No results for input(s): LABPROT,  INR in the last 72 hours. ABG No results for input(s): PHART, HCO3 in the last 72 hours.  Invalid input(s): PCO2, PO2  Studies/Results:  Anti-infectives: Anti-infectives (From admission, onward)   Start     Dose/Rate Route Frequency Ordered Stop   03/09/19 1400  piperacillin-tazobactam (ZOSYN) IVPB 3.375 g     3.375 g 12.5 mL/hr over 240 Minutes Intravenous Every 8 hours 03/09/19 1141     03/06/19 1600  cefTRIAXone (ROCEPHIN) 2 g in sodium chloride 0.9 % 100 mL IVPB  Status:  Discontinued     2 g 200 mL/hr over 30 Minutes Intravenous Every 24 hours 03/05/19 2158 03/09/19 1141   03/06/19 0600  cefTRIAXone (ROCEPHIN) 2 g in sodium chloride 0.9 % 100 mL IVPB  Status:  Discontinued     2 g 200 mL/hr over 30 Minutes Intravenous On call to O.R. 03/05/19 2326 03/05/19 2329   03/05/19 2326  metroNIDAZOLE (FLAGYL) IVPB 500 mg  Status:  Discontinued     500 mg 100 mL/hr over 60 Minutes Intravenous Every 8 hours 03/05/19 2326 03/05/19 2329   03/05/19 2158  metroNIDAZOLE (FLAGYL) IVPB 500 mg  Status:  Discontinued     500 mg 100 mL/hr over 60 Minutes Intravenous Every 8 hours 03/05/19 2158 03/09/19 1141   03/05/19 1615  cefTRIAXone (ROCEPHIN) 2 g in sodium chloride 0.9 % 100 mL IVPB     2 g 200 mL/hr over 30 Minutes Intravenous  Once 03/05/19 1613 03/05/19 1728   03/05/19 1615  metroNIDAZOLE (FLAGYL) IVPB 500 mg     500 mg 100 mL/hr over 60 Minutes Intravenous  Once 03/05/19 1613 03/05/19 1817  Medications: Scheduled Meds: . docusate sodium  100 mg Oral BID  . enoxaparin (LOVENOX) injection  40 mg Subcutaneous Q24H  . feeding supplement  1 Container Oral TID BM  . feeding supplement (ENSURE ENLIVE)  237 mL Oral BID BM  . metoCLOPramide (REGLAN) injection  5 mg Intravenous Q8H  . multivitamin with minerals  1 tablet Oral Daily  . pantoprazole (PROTONIX) IV  40 mg Intravenous QHS  . sodium chloride flush  10-40 mL Intracatheter Q12H   Continuous Infusions: . chlorproMAZINE  (THORAZINE) IV    . dextrose 5 % and 0.9% NaCl 125 mL/hr at 03/12/19 0533  . piperacillin-tazobactam (ZOSYN)  IV 3.375 g (03/12/19 0535)   PRN Meds:.acetaminophen **OR** acetaminophen, chlorproMAZINE (THORAZINE) IV, fentaNYL (SUBLIMAZE) injection, hydrALAZINE, [COMPLETED] ketorolac **FOLLOWED BY** ketorolac, methocarbamol, ondansetron **OR** ondansetron (ZOFRAN) IV, prochlorperazine, promethazine, sodium chloride flush  Rodman PickleLuke Aaron Willet Schleifer, MD [facility]

## 2019-03-12 NOTE — TOC Progression Note (Signed)
Transition of Care [facility]) - Progression Note    Patient Details  Name: Kyle Moss MRN: [id number] Date of Birth: 1986/10/24  Transition of Care [facility]) CM/SW Contact  Deveron Furlong, RN Phone Number: 03/12/2019, 12:20 PM  Clinical Narrative:    PT evaluated patient today and recommends HH PT.  PT added to [facility] order.   Expected Discharge Plan: Home w Home Health Services Barriers to Discharge: Continued Medical Work up  Expected Discharge Plan and Services Expected Discharge Plan: Home w Home Health Services   Discharge Planning Services: CM Consult Post Acute Care Choice: Home Health Living arrangements for the past 2 months: Single Family Home                   DME Agency: NA       HH Arranged: PT, RN HH Agency: [facility]         Social Determinants of Health (SDOH) Interventions    Readmission Risk Interventions No flowsheet data found.

## 2019-03-13 MED ORDER — TRAMADOL HCL 50 MG PO TABS: 50.0000 mg | ORAL_TABLET | Freq: Four times a day (QID) | ORAL | 0 refills | Status: DC | PRN

## 2019-03-13 MED ORDER — AMOXICILLIN-POT CLAVULANATE 875-125 MG PO TABS: 1.0000 | ORAL_TABLET | Freq: Two times a day (BID) | ORAL | 0 refills | Status: AC

## 2019-03-13 MED ORDER — TRAMADOL HCL 50 MG PO TABS
50.0000 mg | ORAL_TABLET | Freq: Four times a day (QID) | ORAL | Status: DC | PRN
  Administered 2019-03-13: 13:00:00 50 mg via ORAL
  Filled 2019-03-13: qty 1

## 2019-03-13 NOTE — Discharge Summary (Signed)
[facility] Summary   Patient ID: Kyle Moss [place] MRN: [id number] DOB/AGE: [birth year] 33 y.o.  Admit date: 03/05/2019 Discharge date: 03/13/2019  Admitting Diagnosis: Colonic perforation with peritonitis AKI  Discharge Diagnosis Patient Active Problem List   Diagnosis Date Noted  . Colon perforation (HCC) 03/05/2019    Consultants none  Imaging: No results found.  Procedures Dr. Luisa Hartornett (03/05/19) - Exploratory laparotomy with resection of distal descending colon and drainage of large intra-abdominal abscess with end colostomy  HPI: Chief Complaint: Abdominal pain x2 weeks HPI: [place] [facility] secondary to abdominal pain.  He presents to the emergency room this morning with progressive lower abdominal pain.  The pain service 2 weeks ago.  Is been associated with diarrhea. There is been no blood in his stool.  He has no medical problems and takes no chronic medications.  CT scan showed perforation of his distal descending colon or sigmoid colon.  There is free air of through the umbilicus.  He was extremely tender in his lower abdomen.  He also had an elevated white count of 15,000 and a fever to 101.  He denies any recent history of cough, shortness of breath or other complicating issues.  Hospital Course:  Workup showed Colonic perforation with peritonitis.  Patient was admitted and underwent procedure listed above.  Tolerated procedure well and was transferred to the floor.  Postop course complicated by ileus and WBC remained elevated. CT was obtained on 04/24 which showed ileus and possible liver abscesses. Follow up CT recommended. This was discussed with the pt on 04/27 and he is aware that he needs a repeat CT scan to follow liver lesions. WBC, creatinine and ileus improved. Diet was advanced as tolerated. Pathology showed diverticulitis.   On POD#8, the patient was voiding well, tolerating diet,  ambulating well, pain well controlled, vital signs stable, incisions c/d/i and felt stable for discharge home.  Patient will follow up as outlined below and knows to call with questions or concerns.     Patient was discharged in good condition.  The [place] Substance controlled database was reviewed prior to prescribing narcotic pain medication to this patient.  Physical Exam: General:  Alert, NAD, pleasant, cooperative Cardio: RRR, S1 & S2 normal, no murmur, rubs, gallops Resp: Effort normal, lungs CTA bilaterally, no wheezes, rales, rhonchi Abd:  Soft, ND, normal bowel sounds, no tenderness, midline wound with good base of granulation tissues and no purulent drainage noted. JP drain with minimal sanguinous drainage. Stoma pink. Gas and stool in ostomy bag.  Skin: warm and dry, no rashes noted Extremities: no swelling, TTP to calves b/l  Allergies as of 03/13/2019   No Known Allergies     Medication List    TAKE these medications   amoxicillin-clavulanate 875-125 MG tablet Commonly known as:  Augmentin Take 1 tablet by mouth every 12 (twelve) hours for 6 days.   multivitamin with minerals Tabs tablet Take 1 tablet by mouth daily.   OVER THE COUNTER MEDICATION Take 1 tablet by mouth daily. Tumeric   PROBIOTIC ADVANCED PO Take 1 tablet by mouth daily.   traMADol 50 MG tablet Commonly known as:  ULTRAM Take 1 tablet (50 mg total) by mouth every 6 (six) hours as needed for moderate pain.        Follow-up Information    [facility] Follow up.   Why:  [phone]       Cornett, Maisie Fushomas, MD. Go on  04/03/2019.   Specialty:  General Surgery Why:  at 9:10am. Please arrive 20 minutes prior to complete paperwork. please bring photo ID and insurance card. Contact information: [address] [place] [address] [phone]        [facility] Go on 03/31/2019.   Why:  in the morning for a CT scan of your abdomen to look  at the possible liver abscesses seen on previous CT. No need to make an appointment but call ahead of time to confirm.  [facility]  Contact information: [address] [place] [address] [phone]           Signed: Joyce Copa [facility] 03/13/2019, 10:38 AM Pager: [phone] Consults: [phone] Mon-Fri 7:00 am-4:30 pm Sat-Sun 7:00 am-11:30 am

## 2019-03-13 NOTE — Plan of Care (Signed)
Problem: Education: Goal: Ability to identify and develop effective coping behavior will improve Outcome: Progressing   Problem: Health Behavior/Discharge Planning: Goal: Identification of resources available to assist in meeting health care needs will improve Outcome: Progressing   Problem: Self-Concept: Goal: Expressions of positive opinion of body image will increase Outcome: Progressing Goal: Will verbalize positive feelings about self Outcome: Progressing   Problem: Education: Goal: Knowledge of General Education information will improve Description Including pain rating scale, medication(s)/side effects and non-pharmacologic comfort measures Outcome: Progressing   Problem: Health Behavior/Discharge Planning: Goal: Ability to manage health-related needs will improve Outcome: Progressing   Problem: Clinical Measurements: Goal: Ability to maintain clinical measurements within normal limits will improve Outcome: Progressing Goal: Respiratory complications will improve Outcome: Progressing Goal: Cardiovascular complication will be avoided Outcome: Progressing   Problem: Activity: Goal: Risk for activity intolerance will decrease Outcome: Progressing   Problem: Nutrition: Goal: Adequate nutrition will be maintained Outcome: Progressing   Problem: Coping: Goal: Level of anxiety will decrease Outcome: Progressing   Problem: Elimination: Goal: Will not experience complications related to bowel motility Outcome: Progressing Goal: Will not experience complications related to urinary retention Outcome: Progressing   Problem: Pain Managment: Goal: General experience of comfort will improve Outcome: Progressing   Problem: Skin Integrity: Goal: Risk for impaired skin integrity will decrease Outcome: Progressing

## 2019-03-13 NOTE — Progress Notes (Signed)
AVS given and reviewed with pt and pt's mother. Medications reviewed and discussed. Abdominal dressing changed prior to discharge per Mattie Marlin, PA. Pt and pt's mother verbalized understanding. All questions answered to satisfaction. Pt had no further questions regarding colostomy care. Pt to be escorted off the unit via wheelchair by volunteer services.

## 2019-03-13 NOTE — Discharge Instructions (Signed)
[facility] °[phone] ° °OPEN ABDOMINAL SURGERY: POST OP INSTRUCTIONS ° °Always review your discharge instruction sheet given to you by the facility where your surgery was performed. ° °IF YOU HAVE DISABILITY OR FAMILY LEAVE FORMS, YOU MUST BRING THEM TO THE OFFICE FOR PROCESSING.  PLEASE DO NOT GIVE THEM TO YOUR DOCTOR. ° °1. A prescription for pain medication may be given to you upon discharge.  Take your pain medication as prescribed, if needed.  If narcotic pain medicine is not needed, then you may take acetaminophen (Tylenol) or ibuprofen (Advil) as needed. °2. Take your usually prescribed medications unless otherwise directed. °3. If you need a refill on your pain medication, please contact your pharmacy. They will contact our office to request authorization.  Prescriptions will not be filled after 5pm or on week-ends. °4. You should follow a light diet the first few days after arrival home, such as soup and crackers, pudding, etc.unless your doctor has advised otherwise. A high-fiber, low fat diet can be resumed as tolerated.   Be sure to include lots of fluids daily. Most patients will experience some swelling and bruising on the chest and neck area.  Ice packs will help.  Swelling and bruising can take several days to resolve °5. Most patients will experience some swelling and bruising in the area of the incision. Ice pack will help. Swelling and bruising can take several days to resolve..  °6. It is common to experience some constipation if taking pain medication after surgery.  Increasing fluid intake and taking a stool softener will usually help or prevent this problem from occurring.  A mild laxative (Milk of Magnesia or Miralax) should be taken according to package directions if there are no bowel movements after 48 hours. °7.  You may have steri-strips (small skin tapes) in place directly over the incision.  These strips should be left on the skin for 7-10 days.  If your  surgeon used skin glue on the incision, you may shower in 24 hours.  The glue will flake off over the next 2-3 weeks.  Any sutures or staples will be removed at the office during your follow-up visit. You may find that a light gauze bandage over your incision may keep your staples from being rubbed or pulled. You may shower and replace the bandage daily. °8. ACTIVITIES:  You may resume regular (light) daily activities beginning the next day--such as daily self-care, walking, climbing stairs--gradually increasing activities as tolerated.  You may have sexual intercourse when it is comfortable.  Refrain from any heavy lifting or straining until approved by your doctor. °a. You may drive when you no longer are taking prescription pain medication, you can comfortably wear a seatbelt, and you can safely maneuver your car and apply brakes °b. Return to Work: ___________________________________ °9. You should see your doctor in the office for a follow-up appointment approximately two weeks after your surgery.  Make sure that you call for this appointment within a day or two after you arrive home to insure a convenient appointment time. °OTHER INSTRUCTIONS:  °_____________________________________________________________ °_____________________________________________________________ ° °WHEN TO CALL YOUR DOCTOR: °1. Fever over 101.0 °2. Inability to urinate °3. Nausea and/or vomiting °4. Extreme swelling or bruising °5. Continued bleeding from incision. °6. Increased pain, redness, or drainage from the incision. °7. Difficulty swallowing or breathing °8. Muscle cramping or spasms. °9. Numbness or tingling in hands or feet or around lips. ° °The clinic staff is available to   answer your questions during regular business hours.  Please don’t hesitate to call and ask to speak to one of the nurses if you have concerns. ° °For further questions, please visit [email or web address] ° ° ° °MIDLINE WOUND CARE: °- midline  dressing to be changed twice daily °- supplies: sterile saline, kerlix, scissors, ABD pads, tape  °- remove dressing and all packing carefully, moistening with sterile saline as needed to avoid packing/internal dressing sticking to the wound. °- clean edges of skin around the wound with water/gauze, making sure there is no tape debris or leakage left on skin that could cause skin irritation or breakdown. °- dampen and clean kerlix with sterile saline and pack wound from wound base to skin level, making sure to take note of any possible areas of wound tracking, tunneling and packing appropriately. Wound can be packed loosely. Trim kerlix to size if a whole kerlix is not required. °- cover wound with a dry ABD pad and secure with tape.  °- write the date/time on the dry dressing/tape to better track when the last dressing change occurred. °- apply any skin protectant/powder recommended by clinician to protect skin/skin folds. °- change dressing as needed if leakage occurs, wound gets contaminated, or patient requests to shower. °- patient may shower daily with wound open and following the shower the wound should be dried and a clean dressing placed.  ° ° ° °Colostomy Home Guide, Adult ° °Colostomy surgery is done to create an opening in the front of the abdomen for stool (feces) to leave the body through an ostomy (stoma). Part of the large intestine is attached to the stoma. A bag, also called a pouch, is fitted over the stoma. Stool and gas will collect in the bag. °After surgery, you will need to empty and change your colostomy bag as needed. You will also need to care for your stoma. °How to care for the stoma °Your stoma should look pink, red, and moist, like the inside of your cheek. Soon after surgery, the stoma may be swollen, but this swelling will go away within 6 weeks. To care for the stoma: °· Keep the skin around the stoma clean and dry. °· Use a clean, soft washcloth to gently wash the stoma and the  skin around it. Clean using a circular motion, and wipe away from the stoma opening, not toward it. °? Use warm water and only use cleansers recommended by your health care provider. °? Rinse the stoma area with plain water. °? Dry the area around the stoma well. °· Use stoma powder or ointment on your skin only as told by your health care provider. Do not use any other powders, gels, wipes, or creams on the skin around the stoma. °· Check the stoma area every day for signs of infection. Check for: °? New or worsening redness, swelling, or pain. °? New or increased fluid or blood. °? Pus or warmth. °· Measure the stoma opening regularly and record the size. Watch for changes. (It is normal for the stoma to get smaller as swelling goes away.) Share this information with your health care provider. °How to empty the colostomy bag ° °Empty your bag at bedtime and whenever it is one-third to one-half full. Do not let the bag get more than half-full with stool or gas. The bag could leak if it gets too full. Some colostomy bags have a built-in gas release valve that releases gas often throughout the day. °Follow these basic steps: °1. Wash   your hands with soap and water. °2. Sit far back on the toilet seat. °3. Put several pieces of toilet paper into the toilet water. This will prevent splashing as you empty stool into the toilet. °4. Remove the clip or the hook-and-loop fastener from the tail end of the bag. °5. Unroll the tail, then empty the stool into the toilet. °6. Clean the tail with toilet paper or a moist towelette. °7. Reroll the tail, and close it with the clip or the hook-and-loop fastener. °8. Wash your hands again. °How to change the colostomy bag °Change your bag every 3-4 days or as often as told by your health care provider. Also change the bag if it is leaking or separating from the skin, or if your skin around the stoma looks or feels irritated. Irritated skin may be a sign that the bag is  leaking. °Always have colostomy supplies with you, and follow these basic steps: °1. Wash your hands with soap and water. Have paper towels or tissues nearby to clean any discharge. °2. Remove the old bag and skin barrier. Use your fingers or a warm cloth to gently push the skin away from the barrier. °3. Clean the stoma area with water or with mild soap and water, as directed. Use water to rinse away any soap. °4. Dry the skin. You may use the cool setting on a hair dryer to do this. °5. Use a tracing pattern (template) to cut the skin barrier to the size needed. °6. If you are using a two-piece bag, attach the bag and the skin barrier to each other. Add the barrier ring, if you use one. °7. If directed, apply stoma powder or skin barrier gel to the skin. °8. Warm the skin barrier with your hands, or blow with a hair dryer for 5-10 seconds. °9. Remove the paper from the adhesive strip of the skin barrier. °10. Press the adhesive strip onto the skin around the stoma. °11. Gently rub the skin barrier onto the skin. This creates heat that helps the barrier to stick. °12. Apply stoma tape to the edges of the skin barrier, if desired. °13. Wash your hands again. °General recommendations °· Avoid wearing tight clothes or having anything press directly on your stoma or bag. Change your clothing whenever it is soiled or damp. °· You may shower or bathe with the bag on or off. Do not use harsh or oily soaps or lotions. Dry the skin and bag after bathing. °· Store all supplies in a cool, dry place. Do not leave supplies in extreme heat because some parts can melt or not stick as well. °· Whenever you leave home, take extra clothing and an extra skin barrier and bag with you. °· If your bag gets wet, you can dry it with a hair dryer on the cool setting. °· To prevent odor, you may put drops of ostomy deodorizer in the bag. °· If recommended by your health care provider, put ostomy lubricant inside the bag. This helps stool to  slide out of the bag more easily and completely. °Contact a health care provider if: °· You have new or worsening redness, swelling, or pain around your stoma. °· You have new or increased fluid or blood coming from your stoma. °· Your stoma feels warm to the touch. °· You have pus coming from your stoma. °· Your stoma extends in or out farther than normal. °· You need to change your bag every day. °· You have a   fever. °Get help right away if: °· Your stool is bloody. °· You have nausea or you vomit. °· You have trouble breathing. °Summary °· Measure your stoma opening regularly and record the size. Watch for changes. °· Empty your bag at bedtime and whenever it is one-third to one-half full. Do not let the bag get more than half-full with stool or gas. °· Change your bag every 3-4 days or as often as told by your health care provider. °· Whenever you leave home, take extra clothing and an extra skin barrier and bag with you. °This information is not intended to replace advice given to you by your health care provider. Make sure you discuss any questions you have with your health care provider. °Document Released: 11/05/2003 Document Revised: 07/08/2018 Document Reviewed: 04/28/2017 °Elsevier Interactive Patient Education © 2019 [employer] ° °

## 2019-03-13 NOTE — Progress Notes (Signed)
Work note from [place], [place] provided to pt.

## 2019-03-13 NOTE — Consult Note (Addendum)
WOC Nurse ostomy consult note Surgical team following for assessment and plan of care to abd wound. Stoma type/location:  Colostomy to LLQ Stomal assessment/size: Stoma is red and viable, flush with skin level, 1 1/2 inches Peristomal assessment: intact skin surrounding Output: 70cc liquid brown stool Ostomy pouching: 2pc.  Education provided:  Demonstrated pouch change and patient videotaped it on his phone for a family member to review later.  Pt was able to open and close velcro to empty without assistance. Reviewed pouching routines and ordering supplies.  Enrolled patient in [employer] DC program: Yes, previously Educational materials at the bedside and 5 sets of supplies at the bedside for discharge.  Cammie Mcgee MSN, RN, CWOCN, [place], CNS [phone]

## 2019-03-13 NOTE — Progress Notes (Signed)
Nutrition Follow-up  RD working remotely.  DOCUMENTATION CODES:   Not applicable  INTERVENTION:   -Continue MVI with minerals daily -Continue Ensure Enlive po BID, each supplement provides 350 kcal and 20 grams of protein  NUTRITION DIAGNOSIS:   Increased nutrient needs related to post-op healing as evidenced by estimated needs.  Ongoing  GOAL:   Patient will meet greater than or equal to 90% of their needs  Progressing  MONITOR:   PO intake, Supplement acceptance, Labs, Weight trends, Skin, I & O's  REASON FOR ASSESSMENT:   Malnutrition Screening Tool    ASSESSMENT:   [place] [facility] [place] room secondary to abdominal pain.  He presents to the emergency room this morning with progressive lower abdominal pain.  The pain service 2 weeks ago.  Is been associated with diarrhea.  There is been no blood in his stool.  He has no medical problems and takes no chronic medications.  CT scan showed perforation of his distal descending colon or sigmoid colon.  There is free air of through the umbilicus.  He was extremely tender in his lower abdomen.  He also had an elevated white count of 15,000 and a fever to 101.  He denies any recent history of cough, shortness of breath or other complicating issues.  4/19- s/pProcedure: Exploratorylaparotomy with resection of distal descending colon and drainage of large intra-abdominal abscess with end colostomy 4/23- reglan added 4/24- advanced to clear liquid diet 4/25- advanced to full liquid diet; Ensure Enlive ordered 4/26- advanced to regular diet  Reviewed I/O's: -1.4 L x 24 hours and +2.8 L since admission  UOP: 2 L x 24 hours  Drain output: 55 ml x 24 hours  Colostomy output: 50 ml x 24 hours  Pt remains with variable PO intake; noted meal completion 30-100%. He has been consuming Ensure supplements per [facility] and been compliant with MVI.   Labs reviewed: K: 3.2.   Diet Order:   Diet Order            Diet regular Room service appropriate? Yes; Fluid consistency: Thin  Diet effective now              EDUCATION NEEDS:   No education needs have been identified at this time  Skin:  Skin Assessment: Skin Integrity Issues: Skin Integrity Issues:: Incisions Incisions: closed abdomen  Last BM:  03/12/19 (50 ml output via colostomy)  Height:   Ht Readings from Last 1 Encounters:  03/05/19 5\' 10"  (1.778 m)    Weight:   Wt Readings from Last 1 Encounters:  03/05/19 64.9 kg    Ideal Body Weight:  75.5 kg  BMI:  Body mass index is 20.52 kg/m.  Estimated Nutritional Needs:   Kcal:  1850-2050  Protein:  105-120 grams  Fluid:  > 1.8 L    Adrienne Delay A. Mayford Knife, RD, LDN, CDCES Registered Dietitian II Certified Diabetes Care and Education Specialist Pager: [phone] After hours Pager: [phone]

## 2019-03-14 ENCOUNTER — Other Ambulatory Visit: Payer: Self-pay | Admitting: General Surgery

## 2019-03-14 DIAGNOSIS — Z48815 Encounter for surgical aftercare following surgery on the digestive system: Secondary | ICD-10-CM | POA: Diagnosis not present

## 2019-03-14 DIAGNOSIS — K5792 Diverticulitis of intestine, part unspecified, without perforation or abscess without bleeding: Secondary | ICD-10-CM | POA: Diagnosis not present

## 2019-03-14 DIAGNOSIS — K769 Liver disease, unspecified: Secondary | ICD-10-CM

## 2019-03-14 DIAGNOSIS — Z433 Encounter for attention to colostomy: Secondary | ICD-10-CM | POA: Diagnosis not present

## 2019-03-16 DIAGNOSIS — K5792 Diverticulitis of intestine, part unspecified, without perforation or abscess without bleeding: Secondary | ICD-10-CM | POA: Diagnosis not present

## 2019-03-16 DIAGNOSIS — Z48815 Encounter for surgical aftercare following surgery on the digestive system: Secondary | ICD-10-CM | POA: Diagnosis not present

## 2019-03-16 DIAGNOSIS — Z433 Encounter for attention to colostomy: Secondary | ICD-10-CM | POA: Diagnosis not present

## 2019-03-20 DIAGNOSIS — Z48815 Encounter for surgical aftercare following surgery on the digestive system: Secondary | ICD-10-CM | POA: Diagnosis not present

## 2019-03-20 DIAGNOSIS — Z433 Encounter for attention to colostomy: Secondary | ICD-10-CM | POA: Diagnosis not present

## 2019-03-20 DIAGNOSIS — K5792 Diverticulitis of intestine, part unspecified, without perforation or abscess without bleeding: Secondary | ICD-10-CM | POA: Diagnosis not present

## 2019-03-21 DIAGNOSIS — Z933 Colostomy status: Secondary | ICD-10-CM | POA: Diagnosis not present

## 2019-03-21 DIAGNOSIS — K651 Peritoneal abscess: Secondary | ICD-10-CM | POA: Diagnosis not present

## 2019-03-23 DIAGNOSIS — Z933 Colostomy status: Secondary | ICD-10-CM | POA: Diagnosis not present

## 2019-03-23 DIAGNOSIS — Z48815 Encounter for surgical aftercare following surgery on the digestive system: Secondary | ICD-10-CM | POA: Diagnosis not present

## 2019-03-23 DIAGNOSIS — K5792 Diverticulitis of intestine, part unspecified, without perforation or abscess without bleeding: Secondary | ICD-10-CM | POA: Diagnosis not present

## 2019-03-23 DIAGNOSIS — Z433 Encounter for attention to colostomy: Secondary | ICD-10-CM | POA: Diagnosis not present

## 2019-03-30 DIAGNOSIS — Z433 Encounter for attention to colostomy: Secondary | ICD-10-CM | POA: Diagnosis not present

## 2019-03-30 DIAGNOSIS — Z48815 Encounter for surgical aftercare following surgery on the digestive system: Secondary | ICD-10-CM | POA: Diagnosis not present

## 2019-03-30 DIAGNOSIS — K5792 Diverticulitis of intestine, part unspecified, without perforation or abscess without bleeding: Secondary | ICD-10-CM | POA: Diagnosis not present

## 2019-03-31 ENCOUNTER — Other Ambulatory Visit: Payer: Self-pay

## 2019-03-31 ENCOUNTER — Ambulatory Visit
Admission: RE | Admit: 2019-03-31 | Discharge: 2019-03-31 | Disposition: A | Discharge status: Home / Self Care | Payer: BLUE CROSS/BLUE SHIELD | Source: Ambulatory Visit | Attending: General Surgery | Admitting: General Surgery

## 2019-03-31 DIAGNOSIS — K769 Liver disease, unspecified: Secondary | ICD-10-CM | POA: Diagnosis not present

## 2019-03-31 MED ORDER — IOPAMIDOL (ISOVUE-300) INJECTION 61%
100.0000 mL | Freq: Once | INTRAVENOUS | Status: AC | PRN
  Administered 2019-03-31: 100 mL via INTRAVENOUS

## 2019-05-15 DIAGNOSIS — H5213 Myopia, bilateral: Secondary | ICD-10-CM | POA: Diagnosis not present

## 2019-05-16 ENCOUNTER — Other Ambulatory Visit: Payer: Self-pay | Admitting: Surgery

## 2019-05-16 DIAGNOSIS — K5792 Diverticulitis of intestine, part unspecified, without perforation or abscess without bleeding: Secondary | ICD-10-CM

## 2019-05-24 ENCOUNTER — Ambulatory Visit
Admission: RE | Admit: 2019-05-24 | Discharge: 2019-05-24 | Disposition: A | Discharge status: Home / Self Care | Payer: BC Managed Care – PPO | Source: Ambulatory Visit | Attending: Surgery | Admitting: Surgery

## 2019-05-24 DIAGNOSIS — K5792 Diverticulitis of intestine, part unspecified, without perforation or abscess without bleeding: Secondary | ICD-10-CM

## 2019-05-24 DIAGNOSIS — Z9049 Acquired absence of other specified parts of digestive tract: Secondary | ICD-10-CM | POA: Diagnosis not present

## 2019-05-24 DIAGNOSIS — Z433 Encounter for attention to colostomy: Secondary | ICD-10-CM | POA: Diagnosis not present

## 2019-06-06 ENCOUNTER — Encounter: Payer: Self-pay | Admitting: Nurse Practitioner

## 2019-06-21 ENCOUNTER — Ambulatory Visit: Payer: BC Managed Care – PPO | Admitting: Nurse Practitioner

## 2019-06-21 ENCOUNTER — Encounter: Payer: Self-pay | Admitting: Nurse Practitioner

## 2019-06-21 VITALS — BP 152/78 | HR 80 | Temp 98.6°F | Ht 70.0 in | Wt 173.0 lb

## 2019-06-21 DIAGNOSIS — K631 Perforation of intestine (nontraumatic): Secondary | ICD-10-CM | POA: Diagnosis not present

## 2019-06-21 DIAGNOSIS — K5732 Diverticulitis of large intestine without perforation or abscess without bleeding: Secondary | ICD-10-CM | POA: Diagnosis not present

## 2019-06-21 MED ORDER — GOLYTELY 236 G PO SOLR: 4000.0000 mL | Freq: Once | ORAL | 0 refills | Status: AC

## 2019-06-21 NOTE — Progress Notes (Addendum)
ASSESSMENT / PLAN:   33 year old male with colon perforation, presumably diverticular perforation based on pathology. He is status post exploratory laparotomy with resection of the distal descending colon and drainage of a large intra-abdominal abscess and end colostomy on 03/05/19. Pre-op imaging and intraoperatively there was concern for Crohn's at site of perforation but path c/w diverticulosis / diverticulitis. The remainder of colon appeared normal. Surgeon ran the small bowel and it appeared normal. Barium study of Hartmann's pouch on 05/24/19 was normal.  -will schedule patient for colonoscopy through his colostomy prior to colostomy takedown. The risks and benefits of colonoscopy with possible polypectomy / biopsies were discussed and the patient agrees to proceed.    HPI:    Chief Complaint:  Recent bowel perforation   Patient is a 33 year old male referred by Dr. Dema Severin with [facility]. he was admitted to the hospital 03/05/2019 with lower abdominal pain and fever.  CT showed bowel wall thickening and surrounding inflammatory changes of the descending/sigmoid with a large contained perforation  / ? Abscess.  On the day of admission patient underwent exploratory laparotomy with resection of his distal descending colon and drainage of a large intra-abdominal abscess and an end colostomy.  Follow-up CT scan showed resolution of the large abscess.  There were new small ill-defined low-attenuation lesions in the liver, early liver abscesses could not be excluded.  Patient was discharged home 03/13/2019.  He has since had a repeat CT scan to follow-up on questionable liver abscesses. CTAP w/ contrast 03/31/19 -complete resolution of the prior abscess, prior hepatic abscesses resolved, 4 mm probable cyst in the right liver.   Colson says that he had just felt " off " since January.  His bowel movements varied between normal, loose and constipation.  He has lost weight since surgery but  thinks he had already lost about 10 pounds prior to surgery .  He has never seen blood in his stool.  No family history of colon cancers.  No other GI complaints.  Past Medical History:  Diagnosis Date  . Diverticulitis      Past Surgical History:  Procedure Laterality Date  . COLON SURGERY    . LAPAROTOMY N/A 03/05/2019   Procedure: EXPLORATORY LAPAROTOMY;  Surgeon: Erroll Luna, MD;  Location: [facility] OR;  Service: General;  Laterality: N/A;   Family History  Problem Relation Age of Onset  . Diabetes Maternal Grandmother    Social History   Tobacco Use  . Smoking status: Never Smoker  . Smokeless tobacco: Never Used  Substance Use Topics  . Alcohol use: Never    Frequency: Never  . Drug use: Never   Current Outpatient Medications  Medication Sig Dispense Refill  . Multiple Vitamin (MULTIVITAMIN WITH MINERALS) TABS tablet Take 1 tablet by mouth daily.    Marland Kitchen OVER THE COUNTER MEDICATION Take 1 tablet by mouth daily. Tumeric    . Probiotic Product (PROBIOTIC ADVANCED PO) Take 1 tablet by mouth daily.    . traMADol (ULTRAM) 50 MG tablet Take 1 tablet (50 mg total) by mouth every 6 (six) hours as needed for moderate pain. 20 tablet 0   No current facility-administered medications for this visit.    No Known Allergies   Review of Systems: All systems reviewed and negative except where noted in HPI.    Physical Exam:    Wt Readings from Last 3 Encounters:  06/21/19 173 lb (78.5 kg)  03/05/19 143 lb (64.9 kg)    BP (!) 152/78   Pulse 80   Temp 98.6 F (37 C) (Oral)   Ht 5\' 10"  (1.778 m)   Wt 173 lb (78.5 kg)   BMI 24.82 kg/m  Constitutional:  Pleasant male in no acute distress. Psychiatric: Normal mood and affect. Behavior is normal. EENT: Pupils normal.  Conjunctivae are normal. No scleral icterus. Neck supple.  Cardiovascular: Normal rate, regular rhythm. No edema Pulmonary/chest: Effort normal and breath sounds normal. No wheezing, rales or rhonchi. Abdominal:  Soft, nondistended, nontender. Bowel sounds active throughout. There are no masses palpable.  Left lower quadrant ostomy with light brown stool Neurological: Alert and oriented to person place and time. Skin: Skin is warm and dry. No rashes noted.  Willette ClusterPaula Atif Chapple, NP  06/21/2019, 2:34 PM  Cc: Andria MeuseWhite, Christopher M, MD

## 2019-06-21 NOTE — Patient Instructions (Signed)
If you are age 33 or older, your body mass index should be between 23-30. Your Body mass index is 24.82 kg/m. If this is out of the aforementioned range listed, please consider follow up with your Primary Care Provider.  If you are age 41 or younger, your body mass index should be between 19-25. Your Body mass index is 24.82 kg/m. If this is out of the aformentioned range listed, please consider follow up with your Primary Care Provider.   You have been scheduled for a colonoscopy. Please follow written instructions given to you at your visit today.  Please pick up your prep supplies at the pharmacy within the next 1-3 days. If you use inhalers (even only as needed), please bring them with you on the day of your procedure. Your physician has requested that you go to www.[email or web address].com and enter the access code given to you at your visit today. This web site gives a general overview about your procedure. However, you should still follow specific instructions given to you by our office regarding your preparation for the procedure.  We have sent the following medications to your pharmacy for you to pick up at your convenience: Golytely  Thank you for choosing me and [facility].   Tye Savoy, NP

## 2019-06-22 NOTE — Progress Notes (Signed)
Attending Physician's Attestation   I have reviewed the chart.   I agree with the Advanced Practitioner's note, impression, and recommendations with any updates as below.  We will plan to proceed with colonoscopy through ostomy as well as evaluation of rectal stump to ensure no issues from both regions.  Justice Britain, MD [facility] Advanced Endoscopy Office # [phone]

## 2019-07-05 ENCOUNTER — Telehealth: Payer: Self-pay | Admitting: Gastroenterology

## 2019-07-05 NOTE — Telephone Encounter (Signed)
Pt responded "no" to all screening questions °

## 2019-07-05 NOTE — Telephone Encounter (Signed)

## 2019-07-06 ENCOUNTER — Ambulatory Visit (AMBULATORY_SURGERY_CENTER): Payer: BC Managed Care – PPO | Admitting: Gastroenterology

## 2019-07-06 ENCOUNTER — Encounter: Payer: Self-pay | Admitting: Gastroenterology

## 2019-07-06 ENCOUNTER — Other Ambulatory Visit: Payer: Self-pay

## 2019-07-06 VITALS — BP 131/85 | HR 79 | Temp 99.3°F | Resp 12 | Ht 70.0 in | Wt 173.0 lb

## 2019-07-06 DIAGNOSIS — K5732 Diverticulitis of large intestine without perforation or abscess without bleeding: Secondary | ICD-10-CM | POA: Diagnosis not present

## 2019-07-06 DIAGNOSIS — K529 Noninfective gastroenteritis and colitis, unspecified: Secondary | ICD-10-CM

## 2019-07-06 DIAGNOSIS — K631 Perforation of intestine (nontraumatic): Secondary | ICD-10-CM | POA: Diagnosis not present

## 2019-07-06 DIAGNOSIS — K6289 Other specified diseases of anus and rectum: Secondary | ICD-10-CM | POA: Diagnosis not present

## 2019-07-06 DIAGNOSIS — K5720 Diverticulitis of large intestine with perforation and abscess without bleeding: Secondary | ICD-10-CM | POA: Diagnosis not present

## 2019-07-06 HISTORY — PX: COLONOSCOPY: SHX174

## 2019-07-06 MED ORDER — SODIUM CHLORIDE 0.9 % IV SOLN: 500.0000 mL | Freq: Once | INTRAVENOUS | Status: DC

## 2019-07-06 NOTE — Op Note (Signed)
[facility] Patient Name: Kyle Moss Full Procedure Date: 07/06/2019 3:34 PM MRN: [id number] Endoscopist: Justice Britain , MD Age: 33 Referring MD:  Date of Birth: 04-16-1986 Gender: Male Account #: [id number] Procedure:                Colonoscopy Indications:              Personal history of digestive disease, Post                            surgical follow-up, Diverticulitis, Follow-up of                            diverticulitis, Follow-up of colonic perforation Medicines:                Monitored Anesthesia Care Procedure:                Pre-Anesthesia Assessment:                           - Prior to the procedure, a History and Physical                            was performed, and patient medications and                            allergies were reviewed. The patient's tolerance of                            previous anesthesia was also reviewed. The risks                            and benefits of the procedure and the sedation                            options and risks were discussed with the patient.                            All questions were answered, and informed consent                            was obtained. Prior Anticoagulants: The patient has                            taken no previous anticoagulant or antiplatelet                            agents. ASA Grade Assessment: II - A patient with                            mild systemic disease. After reviewing the risks                            and benefits, the patient was deemed in  satisfactory condition to undergo the procedure.                           After obtaining informed consent, the colonoscope                            was passed under direct vision. Throughout the                            procedure, the patient's blood pressure, pulse, and                            oxygen saturations were monitored continuously. The                            Colonoscope  was introduced through the colostomy                            and advanced to the 5 cm into the ileum. The                            colonoscopy was performed without difficulty. The                            patient tolerated the procedure. The quality of the                            bowel preparation was adequate. The terminal ileum,                            ileocecal valve, appendiceal orifice, and rectum                            were photographed. After completion of the ostomy                            to cecum evaluation, the patient was positioned on                            his left-lateral side and the                            rectum/rectosigmoid/sigmoid regions were visualized. Scope In: 3:42:46 PM Scope Out: 4:01:22 PM Scope Withdrawal Time: 0 hours 14 minutes 56 seconds  Total Procedure Duration: 0 hours 18 minutes 36 seconds  Findings:                 The digital rectal exam findings include                            hemorrhoids. Pertinent negatives include no                            palpable rectal lesions.  The terminal ileum and ileocecal valve appeared                            normal.                           Normal mucosa was found in the descending colon, at                            the splenic flexure, in the transverse colon, at                            the hepatic flexure, in the ascending colon and in                            the cecum.                           There was evidence of a prior end colostomy in the                            sigmoid colon. This was characterized by visible                            suture.                           Adjacent to the end-ostomy were a few small-mouthed                            diverticula were found in the distal sigmoid colon.                            No evidence of acute diverticulitis.                           Patchy moderate inflammation characterized by                             altered vascularity, congestion (edema), erythema                            and granularity was found in the rectum and in the                            recto-sigmoid colon. 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 Biopsies were taken with a                            cold forceps for histology to rule out chronic                              colitis.                           Non-bleeding non-thrombosed internal hemorrhoids                            were found during retroflexion, during perianal                            exam and during digital exam. The hemorrhoids were                            Grade II (internal hemorrhoids that prolapse but                            reduce spontaneously). Complications:            No immediate complications. Estimated Blood Loss:     Estimated blood loss was minimal. Impression:               - Hemorrhoids found on digital rectal exam.                           - The examined portion of the ileum was normal.                           - Normal mucosa in the descending colon, at the                            splenic flexure, in the transverse colon, at the                            hepatic flexure, in the ascending colon and in the                            cecum.                           - End sigmoid colostomy, characterized by visible                            suture.                           - Diverticulosis in the distal sigmoid colon.                           - Patchy moderate inflammation was found in the                            rectum and in the recto-sigmoid colon secondary to                            likely diversion colitis. Biopsied.                           -   The entire clinical picture seems unlikely to be                            a result of underlying IBD as well as with prior                            colon pathology at time of his surgery. Most likely                             still consistent with perforated diverticulitis.                           - Non-bleeding non-thrombosed internal hemorrhoids. Recommendation:           - The patient will be observed post-procedure,                            until all discharge criteria are met.                           - Discharge patient to home.                           - Patient has a contact number available for                            emergencies. The signs and symptoms of potential                            delayed complications were discussed with the                            patient. Return to normal activities tomorrow.                            Written discharge instructions were provided to the                            patient.                           - Resume previous diet.                           - Continue present medications.                           - Await pathology results.                           - Even if diversion colitis is noted, as he is                            asymptomatic (other than small amount of mucos                              production), I think he should be a candidate for                            re-anastomosis if surgical colleagues agree when                            deemed able. Treatments for diversion colitis could                            be considered in future if long-period in time                            prior to his hookup is performed.                           - Repeat colonoscopy at age 50 for screening                            purposes or if other issues arise.                           - The findings and recommendations were discussed                            with the patient.  Mansouraty, MD 07/06/2019 4:14:56 PM 

## 2019-07-06 NOTE — Progress Notes (Signed)
Called to room to assist during endoscopic procedure.  Patient ID and intended procedure confirmed with present staff. Received instructions for my participation in the procedure from the performing physician.  

## 2019-07-06 NOTE — Progress Notes (Signed)
Report given to PACU, vss 

## 2019-07-06 NOTE — Patient Instructions (Signed)
Please read handouts provided. Continue present medications. Await pathology results.        YOU HAD AN ENDOSCOPIC PROCEDURE TODAY AT THE [facility]:   Refer to the procedure report that was given to you for any specific questions about what was found during the examination.  If the procedure report does not answer your questions, please call your gastroenterologist to clarify.  If you requested that your care partner not be given the details of your procedure findings, then the procedure report has been included in a sealed envelope for you to review at your convenience later.  YOU SHOULD EXPECT: Some feelings of bloating in the abdomen. Passage of more gas than usual.  Walking can help get rid of the air that was put into your GI tract during the procedure and reduce the bloating. If you had a lower endoscopy (such as a colonoscopy or flexible sigmoidoscopy) you may notice spotting of blood in your stool or on the toilet paper. If you underwent a bowel prep for your procedure, you may not have a normal bowel movement for a few days.  Please Note:  You might notice some irritation and congestion in your nose or some drainage.  This is from the oxygen used during your procedure.  There is no need for concern and it should clear up in a day or so.  SYMPTOMS TO REPORT IMMEDIATELY:   Following lower endoscopy (colonoscopy or flexible sigmoidoscopy):  Excessive amounts of blood in the stool  Significant tenderness or worsening of abdominal pains  Swelling of the abdomen that is new, acute  Fever of 100F or higher    For urgent or emergent issues, a gastroenterologist can be reached at any hour by calling [phone].   DIET:  We do recommend a small meal at first, but then you may proceed to your regular diet.  Drink plenty of fluids but you should avoid alcoholic beverages for 24 hours.  ACTIVITY:  You should plan to take it easy for the rest of today and you should NOT  DRIVE or use heavy machinery until tomorrow (because of the sedation medicines used during the test).    FOLLOW UP: Our staff will call the number listed on your records 48-72 hours following your procedure to check on you and address any questions or concerns that you may have regarding the information given to you following your procedure. If we do not reach you, we will leave a message.  We will attempt to reach you two times.  During this call, we will ask if you have developed any symptoms of COVID 19. If you develop any symptoms (ie: fever, flu-like symptoms, shortness of breath, cough etc.) before then, please call [phone].  If you test positive for Covid 19 in the 2 weeks post procedure, please call and report this information to us.    If any biopsies were taken you will be contacted by phone or by letter within the next 1-3 weeks.  Please call us at [phone] if you have not heard about the biopsies in 3 weeks.    SIGNATURES/CONFIDENTIALITY: You and/or your care partner have signed paperwork which will be entered into your electronic medical record.  These signatures attest to the fact that that the information above on your After Visit Summary has been reviewed and is understood.  Full responsibility of the confidentiality of this discharge information lies with you and/or your care-partner. 

## 2019-07-10 ENCOUNTER — Telehealth: Payer: Self-pay

## 2019-07-10 NOTE — Telephone Encounter (Signed)
  Follow up Call-  Call back number 07/06/2019  Post procedure Call Back phone  # [phone]  Permission to leave phone message Yes  Some recent data might be hidden     Patient questions:  Do you have a fever, pain , or abdominal swelling? No. Pain Score  0 *  Have you tolerated food without any problems? Yes.    Have you been able to return to your normal activities? Yes.    Do you have any questions about your discharge instructions: Diet   No. Medications  No. Follow up visit  No.  Do you have questions or concerns about your Care? No.  Actions: * If pain score is 4 or above: No action needed, pain <4. 1. Have you developed a fever since your procedure? no  2.   Have you had an respiratory symptoms (SOB or cough) since your procedure? no  3.   Have you tested positive for COVID 19 since your procedure no  4.   Have you had any family members/close contacts diagnosed with the COVID 19 since your procedure?  no   If yes to any of these questions please route to Joylene John, RN and Alphonsa Gin, Therapist, sports.

## 2019-07-12 DIAGNOSIS — Z933 Colostomy status: Secondary | ICD-10-CM | POA: Diagnosis not present

## 2019-07-21 ENCOUNTER — Encounter: Payer: Self-pay | Admitting: Gastroenterology

## 2019-09-17 DIAGNOSIS — Z8616 Personal history of COVID-19: Secondary | ICD-10-CM

## 2019-09-17 HISTORY — DX: Personal history of COVID-19: Z86.16

## 2019-11-09 IMAGING — RF BARIUM ENEMA THROUGH COLOSTOMY
10 of 11 series · 14 of 24 positions shown · non-contrast
Comparison: 03/31/2019 CT abdomen/pelvis

CLINICAL DATA: Status post partial distal colectomy 03/05/2019 with
end colostomy for diverticular disease. Patient presents for
evaluation prior to colostomy takedown.

EXAM:
WATER SOLUBLE CONTRAST ENEMA
TECHNIQUE: Initial evaluation of Hartmann's pouch performed via rectal
catheter. Subsequent evaluation of the proximal colon by retrograde
contrast instillation via the colostomy intubated with foley
catheter.
FLUOROSCOPY TIME:  Fluoroscopy Time:  4 minutes 18 seconds
Radiation Exposure Index (if provided by the fluoroscopic device):
556 mGy
Number of Acquired Spot Images: 17

[Series 1: one shot · 0.14mm/px · 1 of 2 slices shown (1 of 5)]
[im 1/2]
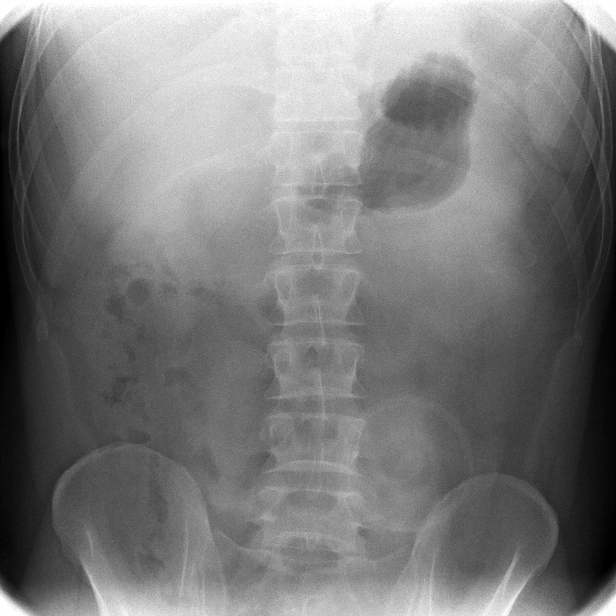

[Series 2: sequence · 1 of 30 frames shown (1 of 5)]
[frame 26/30]
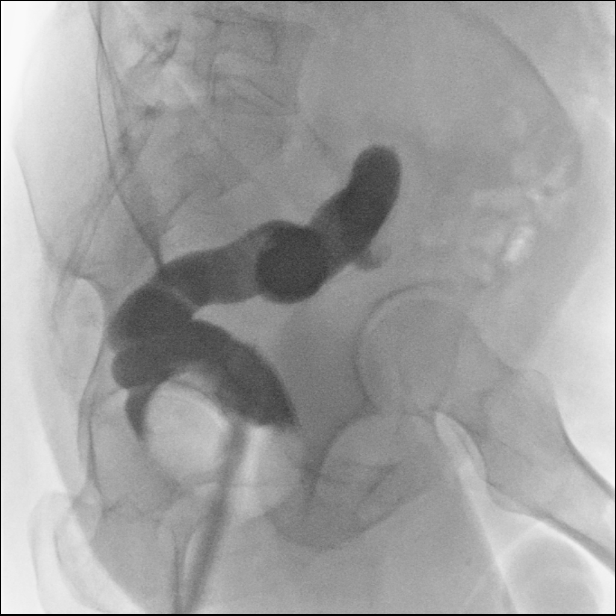

[Series 3: one shot · 0.16mm/px · 1 of 5 slices shown (2 of 5)]
[im 3/5]
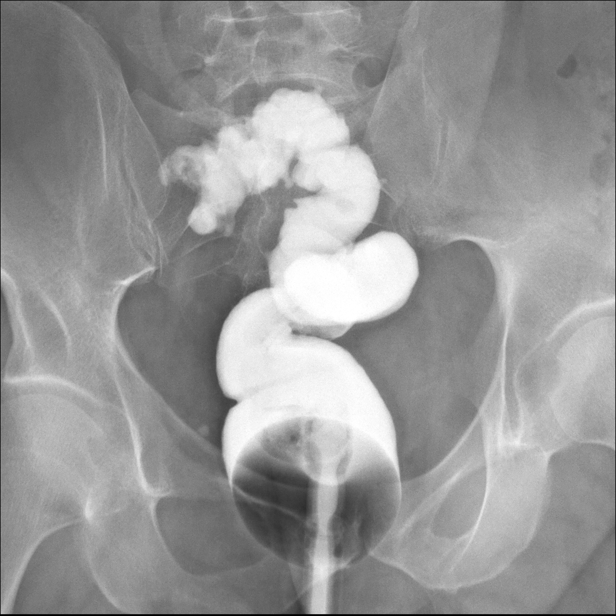

[Series 4: sequence · 2 of 16 frames shown (2 of 5)]
[frame 9/16]
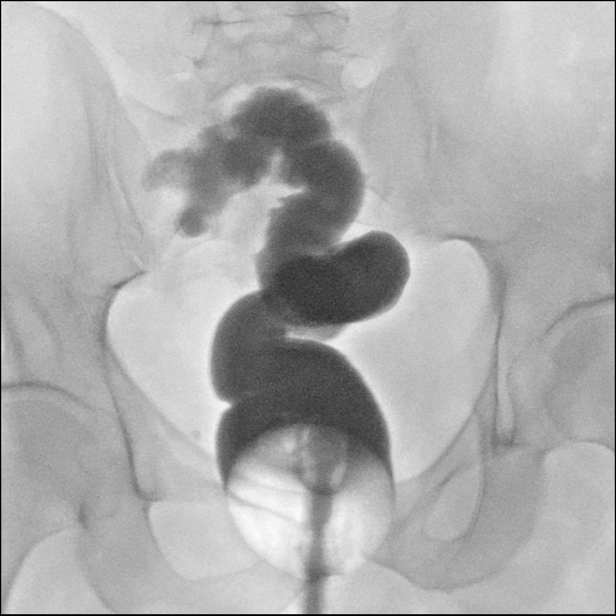
[frame 14/16]
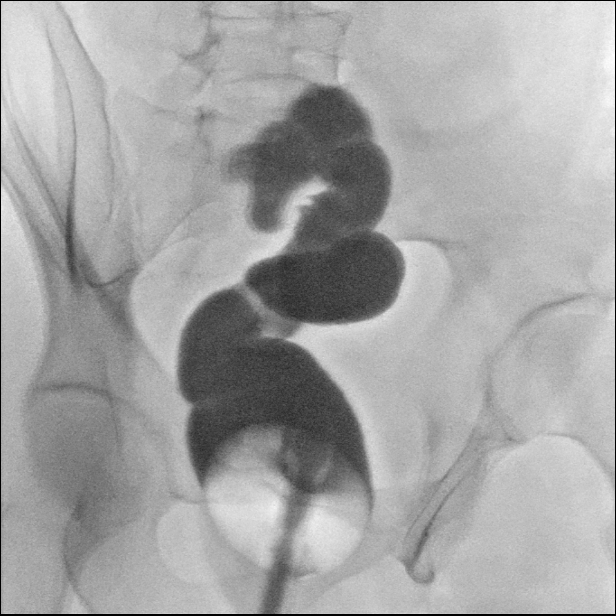

[Series 6: sequence · 1 of 31 frames shown (3 of 5)]
[frame 5/31]
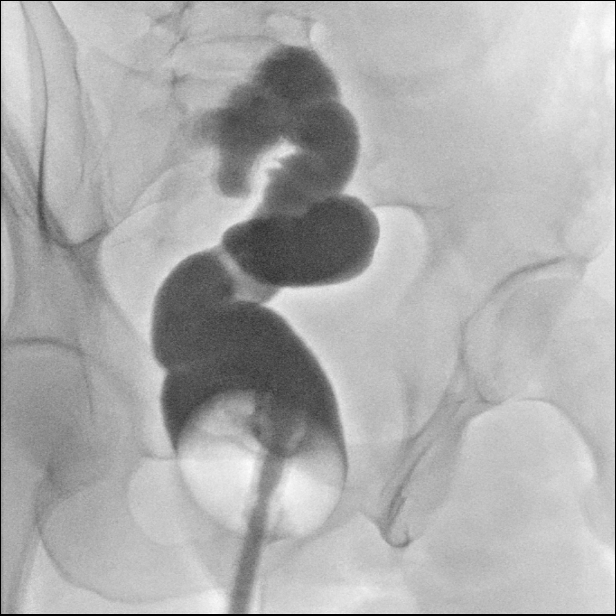

[Series 7: one shot · 0.16mm/px · 2 of 3 slices shown (3 of 5)]
[im 1/3]
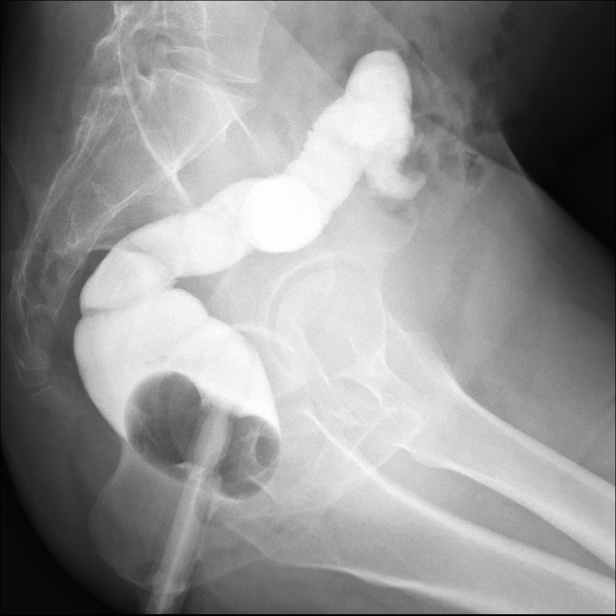
[im 2/3]
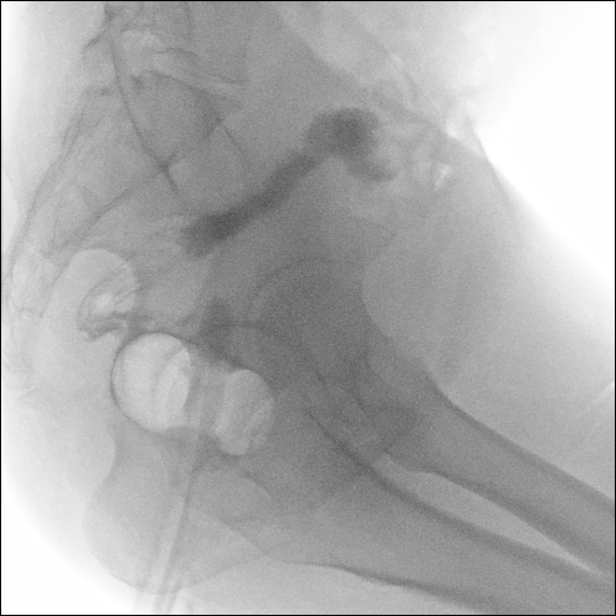

[Series 8: sequence · 1 of 32 frames shown (4 of 5)]
[frame 17/32]
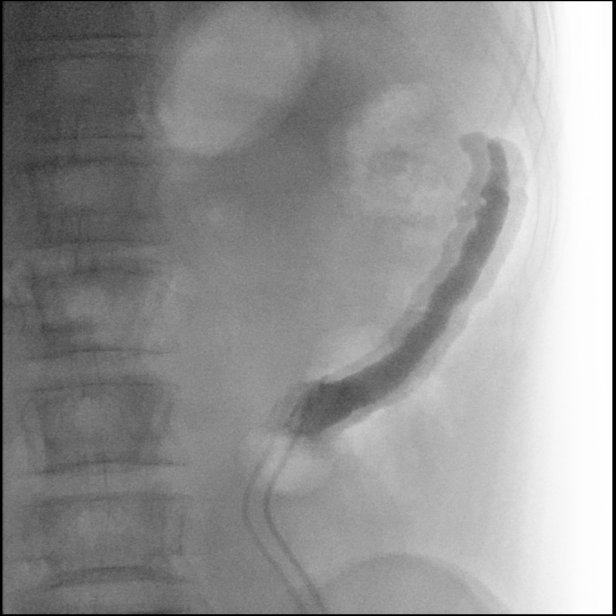

[Series 9: one shot · 0.29mm/px · 2 of 6 slices shown (4 of 5)]
[im 2/6]
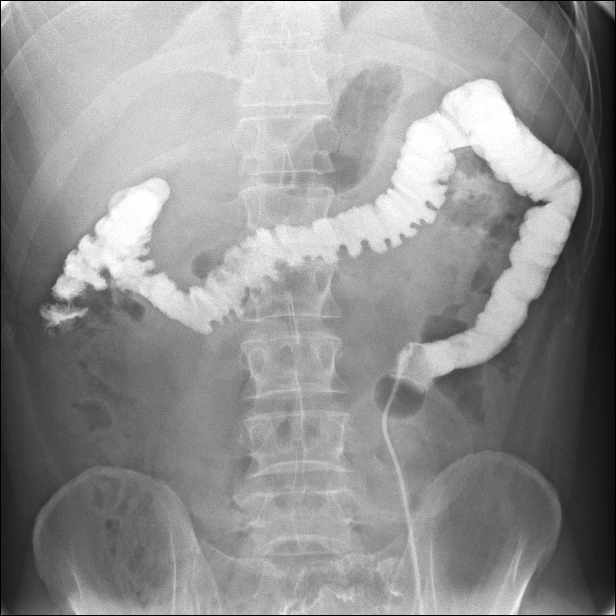
[im 5/6]
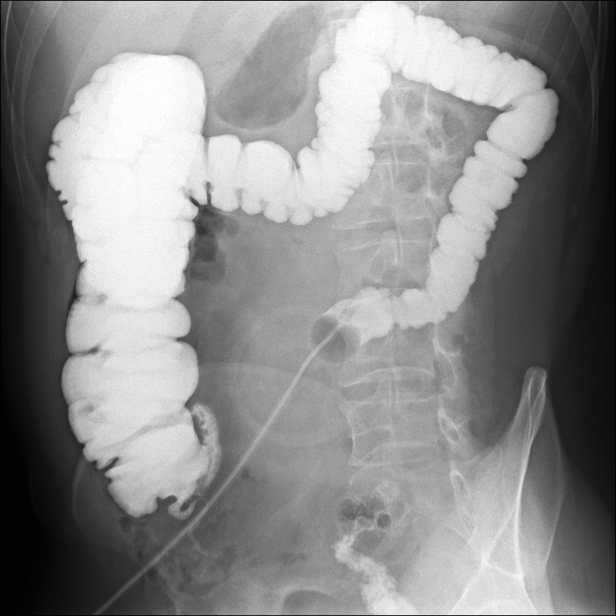

[Series 10: sequence · 2 of 20 frames shown (5 of 5)]
[frame 4/20]
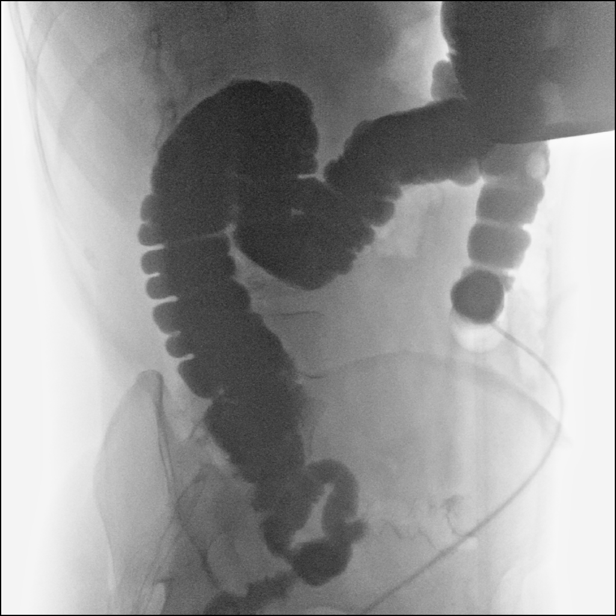
[frame 20/20]
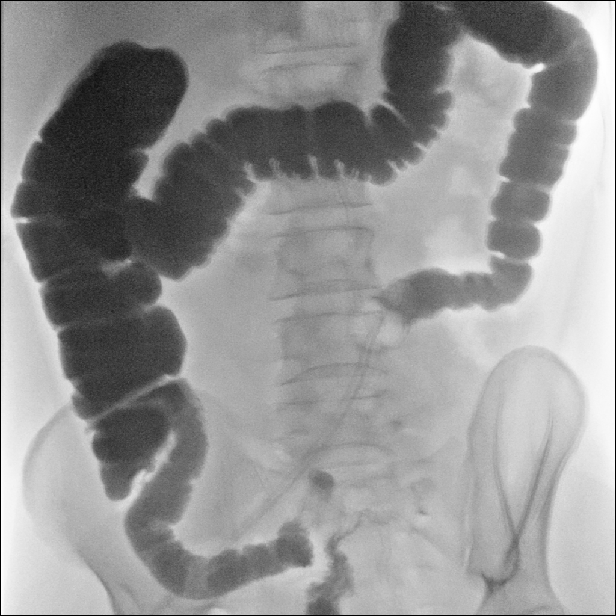

[Series 11: one shot · 0.29mm/px · 1 of 3 slices shown (5 of 5)]
[im 3/3]
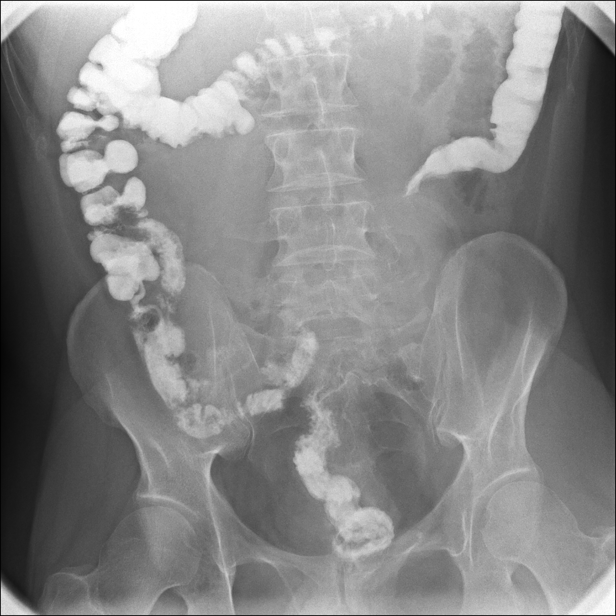

[14 of 24 positions shown; findings below may reference images not displayed]

FINDINGS: Scout radiograph demonstrates no dilated small bowel loops. Minimal
colonic stool. Colostomy bag overlies the left lower quadrant of the
abdomen. No evidence of pneumatosis or pneumoperitoneum. No
radiopaque nephrolithiasis.

Water-soluble contrast was instilled retrograde into the Hartmann's
pouch via rectal catheter without difficulty. Hartmann's pouch
appears normal, with no strictures, fistula or large filling
defects. Hartmann's pouch suture line is intact.

Water-soluble contrast was instilled retrograde by the colostomy to
the level of the cecum with filling of the terminal ileum and
appendix, without difficulty. Normal appearance of colon, with no
strictures, fold thickening, apple-core lesions, fistulae or large
filling defects.
IMPRESSION: Normal retrograde evaluation of the Hartmann's pouch by
water-soluble enema and of the proximal colon by water-soluble
colostomy study.

## 2020-01-02 ENCOUNTER — Other Ambulatory Visit: Payer: Self-pay | Admitting: Urology

## 2020-01-10 ENCOUNTER — Ambulatory Visit: Payer: Self-pay | Admitting: Surgery

## 2020-01-10 NOTE — H&P (Signed)
CC: Follow-up - hx of exlap/Hartmann's for perforated diverticulitis  HPI: [name] is a very pleasant 34yoM whom presented to ED with significant diverticulitis 03/05/2019. He had a large extraluminal collection showed gas and stool consistent with a perforation of his colon and additionally free air tracking up to umbilicus. He was taken emergently to the operating room that evening by my partner, Dr. Luisa Moss for exploratory laparotomy with resection of distal descending colon and drainage of a large intra-abdominal abscess and colostomy. Difficult case due to amount of fibrosis related to this perforation. Based on the appearance of the colon intraoperatively, there were concerns for possible Crohn's disease. The remainder of his GI tract did however appear normal. He was in the hospital for 8 days following this where he slowly recovered. He was discharged 03/13/2019. He has done reasonably well since. He has returned to work. He has been getting along just fine of his colostomy. He denies any complaints aside from having a colostomy. He has been eating well and learning the kinds of foods that can be problematic when having a colostomy.  Pathology from his surgery returned 15 Senior; with diverticulosis and diverticulitis with associated perforation and serositis. Hyperplastic polyp. Benign lymph nodes.  He underwent Gastrografin enema 05/24/2019 which demonstrated a normal-appearing Hartman's pouch. He does have a fair amount of remaining sigmoid on this evaluation. He underwent colonoscopy 07/06/2019 which demonstrated a relatively normal-appearing colon proximal to the stoma. There was no evidence of IBD seen.  INTERVAL HX He has been doing well. He denies any complaints today. He reports he has been getting along fine with his colostomy but is still interested in pursuing colostomy reversal surgery. He denies abdominal pain, nausea/vomiting, ostomy emptying issues. He reports that  he has 1 small bowel movement per rectum each week that is more mucus-like in nature.  PMH: Denies  PSH: Exlap/Hartmann's 02/2019  FHx: Denies FHx of malignancy  Social: Denies use of tobacco/drugs; EtOH use ~once per week. 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  ROS: A comprehensive 10 system review of systems was completed with the patient and pertinent findings as noted above.  The patient is a 34 year old male.   Allergies (Kyle Moss, Moss; 12/27/2019 2:10 PM) No Known Drug Allergies [04/03/2019]: Allergies Reconciled   Medication History (Kyle Moss, Moss; 12/27/2019 2:10 PM) Multi Vitamin (Oral) Active. Probiotic (Oral) Active. Medications Reconciled    Review of Systems Kyle Kyle M. Pecola Haxton MD; 12/27/2019 2:53 PM) General Not Present- Appetite Loss, Chills, Fatigue, Fever, Night Sweats, Weight Gain and Weight Loss. HEENT Present- Seasonal Allergies. Not Present- Earache, Hearing Loss, Hoarseness, Nose Bleed, Oral Ulcers, Ringing in the Ears, Sinus Pain, Sore Throat, Visual Disturbances, Wears glasses/contact lenses and Yellow Eyes. Respiratory Not Present- Bloody sputum, Chronic Cough, Difficulty Breathing, Snoring and Wheezing. Breast Not Present- Breast Mass, Breast Pain, Nipple Discharge and Skin Changes. Cardiovascular Not Present- Chest Pain, Difficulty Breathing Lying Down, Leg Cramps, Palpitations, Rapid Heart Rate, Shortness of Breath and Swelling of Extremities. Gastrointestinal Not Present- Abdominal Pain, Bloating, Bloody Stool, Change in Bowel Habits, Chronic diarrhea, Constipation, Difficulty Swallowing, Excessive gas, Gets full quickly at meals, Hemorrhoids, Indigestion, Nausea, Rectal Pain and Vomiting. Musculoskeletal Not Present- Back Pain, Joint Pain, Joint Stiffness, Muscle Pain, Muscle Weakness and Swelling of Extremities. Neurological Not Present- Decreased Memory, Fainting, Headaches, Numbness, Seizures, Tingling,  Tremor, Trouble walking and Weakness. Psychiatric Not Present- Anxiety, Bipolar, Change in Sleep Pattern, Depression, Fearful and Frequent crying. Endocrine Not  Present- Cold Intolerance, Excessive Hunger, Hair Changes, Heat Intolerance and New Diabetes. Hematology Not Present- Blood Thinners, Easy Bruising, Excessive bleeding, Gland problems, HIV and Persistent Infections.  Vitals (Kyle Moss; 12/27/2019 2:10 PM) 12/27/2019 2:10 PM Weight: 191.4 lb Height: 70in Body Surface Area: 2.05 m Body Mass Index: 27.46 kg/m  Temp.: 98.56F  Pulse: 120 (Regular)  BP: 136/82 (Sitting, Left Arm, Standard)       Physical Exam Kyle Gave M. Ahmyah Gidley MD; 12/27/2019 2:53 PM) The physical exam findings are as follows: Note:Constitutional: No acute distress; conversant; no deformities; wearing mask Eyes: Moist conjunctiva; no lid lag; anicteric sclerae; pupils equal and round Neck: Trachea midline; no palpable thyromegaly Lungs: Normal respiratory effort; no tactile fremitus CV: rrr; no palpable thrill; no pitting edema GI: Abdomen soft, nontender, nondistended; no palpable hepatosplenomegaly; midline wound well healed; colostomy in LLQ pink with gas and stool in appliance MSK: Normal gait; no clubbing/cyanosis Psychiatric: Appropriate affect; alert and oriented 3 Lymphatic: No palpable cervical or axillary lymphadenopathy    Assessment & Plan Kyle Gave M. Micha Dosanjh MD; 12/27/2019 2:56 PM) STATUS POST HARTMANN PROCEDURE (Z93.3) Story: Mr. Moss is a very pleasant 34yoM with hx of perforated diverticulitis s/p Hartmann's procedure 02/2019 by Dr. Brantley Moss - requesting colostomy reversal -GGE showed remnant sigmoid but otherwise clear -Colonoscopy 07/06/2019 - no concerns for IBD; diversion proctopathy as expected Impression: -Will plan for laparoscopic, possible open, Hartmann's reversal, flexible sigmoidoscopy, cystoscopy/stents by urology - no sooner than mid April 2021  -The  anatomy and physiology of the GI tract was discussed at length with the patient. We reviewed his current anatomy and he has requested colostomy reversal surgery. We discussed the technical aspects of this at length including major morbidity associated with the surgery of up to 20%. -The planned procedures, material risks (including, but not limited to, pain, bleeding, infection, scarring, need for blood transfusion, damage to surrounding structures- blood vessels/nerves/viscus/organs, damage to ureter, urine leak, leak from anastomosis, need for additional procedures, worsening of pre-existing medical conditions, need for stoma which may be permanent, scenarios were procedural abortion may occur, hernia, recurrent diverticulitis, DVT/PE, pneumonia, heart attack, stroke, death) benefits and alternatives to surgery were discussed at length. The patient's questions were answered to his satisfaction, he voiced understanding and has elected to proceed with surgery. Additionally, we discussed typical postoperative expectations and the recovery process.  This patient encounter took 35 minutes today to perform the following: take history, perform exam, review outside records, interpret imaging, counsel the patient on their diagnosis and document encounter, findings & plan in the EHR  Signed by Ileana Roup, MD (12/27/2019 2:56 PM)

## 2020-02-26 ENCOUNTER — Encounter (HOSPITAL_COMMUNITY): Payer: Self-pay

## 2020-02-26 NOTE — Patient Instructions (Addendum)
DUE TO COVID-19 ONLY TWO VISITORS ARE ALLOWED TO COME WITH YOU AND STAY IN THE WAITING ROOM ONLY DURING PRE OP AND PROCEDURE. THE TWO VISITORS MAY VISIT WITH YOU IN YOUR PRIVATE ROOM DURING VISITING HOURS ONLY!!   COVID SWAB TESTING MUST BE COMPLETED ON: Saturday, March 02, 2020 at 9:10 AM     [address], [place] -[facility] enter pre surgical testing line (Must self quarantine after testing. Follow instructions on handout.)             Your procedure is scheduled on: Wednesday, March 06, 2020   Report to [facility] Main  Entrance    Report to admitting at 6:30 AM   Call this number if you have problems the morning of surgery [phone]   Increase oral fluid intake the day of prep for surgery   Dulcolax: Take the day before surgery   Miralax:  Mix with 64 oz Gatorade/Powerade. Drink gradually over the next few hours (8 oz glass every 15-30 minutes) until gone the day prior to surgery.    Neomycin and Metronidazole:  At 2 pm, 3 pm and 10 pm after Miralax bowel prep the day prior to surgery.    Drink 2 Ensure drinks the night before surgery.     Do not eat food:After Midnight.   May have liquids until 5:30 AM day of surgery   CLEAR LIQUID DIET  Foods Allowed                                                                     Foods Excluded  Water, Black Coffee and tea, regular and decaf                             liquids that you cannot  Plain Jell-O in any flavor  (No red)                                           see through such as: Fruit ices (not with fruit pulp)                                     milk, soups, orange juice  Iced Popsicles (No red)                                    All solid food Carbonated beverages, regular and diet                                    Apple juices Sports drinks like Gatorade (No red) Lightly seasoned clear broth or consume(fat free) Sugar, honey syrup  Sample Menu Breakfast                                 Lunch  Supper Cranberry juice                    Beef broth                            Chicken broth Jell-O                                     Grape juice                           Apple juice Coffee or tea                        Jell-O                                      Popsicle                                                Coffee or tea                        Coffee or tea   Complete one Ensure drink the morning of surgery at 5:30 AM the day of surgery.   Oral Hygiene is also important to reduce your risk of infection.                                    Remember - BRUSH YOUR TEETH THE MORNING OF SURGERY WITH YOUR REGULAR TOOTHPASTE   Do NOT smoke after Midnight   Take these medicines the morning of surgery with A SIP OF WATER: None  DO NOT TAKE ANY ORAL DIABETIC MEDICATIONS DAY OF YOUR SURGERY                               You may not have any metal on your body including hair pins, jewelry, and body piercings             Do not wear make-up, lotions, powders, perfumes/cologne, or deodorant             Do not wear nail polish.  Do not shave  48 hours prior to surgery.              Men may shave face and neck.   Do not bring valuables to the hospital. [facility] IS NOT             RESPONSIBLE   FOR VALUABLES.   Contacts, dentures or bridgework may not be worn into surgery.   Bring small overnight bag day of surgery.    Special Instructions: Bring a copy of your healthcare power of attorney and living will documents         the day of surgery if you haven't scanned them in before.              Please read over the following fact sheets you were given: IF YOU HAVE  QUESTIONS ABOUT YOUR PRE OP INSTRUCTIONS PLEASE CALL [phone]   [facility] - Preparing for Surgery Before surgery, you can play an important role.  Because skin is not sterile, your skin needs to be as free of germs as possible.  You can reduce the number of germs  on your skin by washing with CHG (chlorahexidine gluconate) soap before surgery.  CHG is an antiseptic cleaner which kills germs and bonds with the skin to continue killing germs even after washing. Please DO NOT use if you have an allergy to CHG or antibacterial soaps.  If your skin becomes reddened/irritated stop using the CHG and inform your nurse when you arrive at Short Stay. Do not shave (including legs and underarms) for at least 48 hours prior to the first CHG shower.  You may shave your face/neck.  Please follow these instructions carefully:  1.  Shower with CHG Soap the night before surgery and the  morning of surgery.  2.  If you choose to wash your hair, wash your hair first as usual with your normal  shampoo.  3.  After you shampoo, rinse your hair and body thoroughly to remove the shampoo.                             4.  Use CHG as you would any other liquid soap.  You can apply chg directly to the skin and wash.  Gently with a scrungie or clean washcloth.  5.  Apply the CHG Soap to your body ONLY FROM THE NECK DOWN.   Do   not use on face/ open                           Wound or open sores. Avoid contact with eyes, ears mouth and   genitals (private parts).                       Wash face,  Genitals (private parts) with your normal soap.             6.  Wash thoroughly, paying special attention to the area where your    surgery  will be performed.  7.  Thoroughly rinse your body with warm water from the neck down.  8.  DO NOT shower/wash with your normal soap after using and rinsing off the CHG Soap.                9.  Pat yourself dry with a clean towel.            10.  Wear clean pajamas.            11.  Place clean sheets on your bed the night of your first shower and do not  sleep with pets. Day of Surgery : Do not apply any lotions/deodorants the morning of surgery.  Please wear clean clothes to the hospital/surgery center.  FAILURE TO FOLLOW THESE INSTRUCTIONS MAY RESULT IN  THE CANCELLATION OF YOUR SURGERY  PATIENT SIGNATURE_________________________________  NURSE SIGNATURE__________________________________  ________________________________________________________________________   Rogelia MireIncentive Spirometer  An incentive spirometer is a tool that can help keep your lungs clear and active. This tool measures how well you are filling your lungs with each breath. Taking long deep breaths may help reverse or decrease the chance of developing breathing (pulmonary) problems (especially infection) following:  A long period of time when you  are unable to move or be active. BEFORE THE PROCEDURE   If the spirometer includes an indicator to show your best effort, your nurse or respiratory therapist will set it to a desired goal.  If possible, sit up straight or lean slightly forward. Try not to slouch.  Hold the incentive spirometer in an upright position. INSTRUCTIONS FOR USE  1. Sit on the edge of your bed if possible, or sit up as far as you can in bed or on a chair. 2. Hold the incentive spirometer in an upright position. 3. Breathe out normally. 4. Place the mouthpiece in your mouth and seal your lips tightly around it. 5. Breathe in slowly and as deeply as possible, raising the piston or the ball toward the top of the column. 6. Hold your breath for 3-5 seconds or for as long as possible. Allow the piston or ball to fall to the bottom of the column. 7. Remove the mouthpiece from your mouth and breathe out normally. 8. Rest for a few seconds and repeat Steps 1 through 7 at least 10 times every 1-2 hours when you are awake. Take your time and take a few normal breaths between deep breaths. 9. The spirometer may include an indicator to show your best effort. Use the indicator as a goal to work toward during each repetition. 10. After each set of 10 deep breaths, practice coughing to be sure your lungs are clear. If you have an incision (the cut made at the time of  surgery), support your incision when coughing by placing a pillow or rolled up towels firmly against it. Once you are able to get out of bed, walk around indoors and cough well. You may stop using the incentive spirometer when instructed by your caregiver.  RISKS AND COMPLICATIONS  Take your time so you do not get dizzy or light-headed.  If you are in pain, you may need to take or ask for pain medication before doing incentive spirometry. It is harder to take a deep breath if you are having pain. AFTER USE  Rest and breathe slowly and easily.  It can be helpful to keep track of a log of your progress. Your caregiver can provide you with a simple table to help with this. If you are using the spirometer at home, follow these instructions: [place] IF:   You are having difficultly using the spirometer.  You have trouble using the spirometer as often as instructed.  Your pain medication is not giving enough relief while using the spirometer.  You develop fever of 100.5 F (38.1 C) or higher. SEEK IMMEDIATE MEDICAL CARE IF:   You cough up bloody sputum that had not been present before.  You develop fever of 102 F (38.9 C) or greater.  You develop worsening pain at or near the incision site. MAKE SURE YOU:   Understand these instructions.  Will watch your condition.  Will get help right away if you are not doing well or get worse. Document Released: 03/15/2007 Document Revised: 01/25/2012 Document Reviewed: 05/16/2007 ExitCare Patient Information 2014 ExitCare, [place].   ________________________________________________________________________  WHAT IS A BLOOD TRANSFUSION? Blood Transfusion Information  A transfusion is the replacement of blood or some of its parts. Blood is made up of multiple cells which provide different functions.  Red blood cells carry oxygen and are used for blood loss replacement.  White blood cells fight against infection.  Platelets  control bleeding.  Plasma helps clot blood.  Other blood  products are available for specialized needs, such as hemophilia or other clotting disorders. BEFORE THE TRANSFUSION  Who gives blood for transfusions?   Healthy volunteers who are fully evaluated to make sure their blood is safe. This is blood bank blood. Transfusion therapy is the safest it has ever been in the practice of medicine. Before blood is taken from a donor, a complete history is taken to make sure that person has no history of diseases nor engages in risky social behavior (examples are intravenous drug use or sexual activity with multiple partners). The donor's travel history is screened to minimize risk of transmitting infections, such as malaria. The donated blood is tested for signs of infectious diseases, such as HIV and hepatitis. The blood is then tested to be sure it is compatible with you in order to minimize the chance of a transfusion reaction. If you or a relative donates blood, this is often done in anticipation of surgery and is not appropriate for emergency situations. It takes many days to process the donated blood. RISKS AND COMPLICATIONS Although transfusion therapy is very safe and saves many lives, the main dangers of transfusion include:   Getting an infectious disease.  Developing a transfusion reaction. This is an allergic reaction to something in the blood you were given. Every precaution is taken to prevent this. The decision to have a blood transfusion has been considered carefully by your caregiver before blood is given. Blood is not given unless the benefits outweigh the risks. AFTER THE TRANSFUSION  Right after receiving a blood transfusion, you will usually feel much better and more energetic. This is especially true if your red blood cells have gotten low (anemic). The transfusion raises the level of the red blood cells which carry oxygen, and this usually causes an energy increase.  The nurse  administering the transfusion will monitor you carefully for complications. HOME CARE INSTRUCTIONS  No special instructions are needed after a transfusion. You may find your energy is better. Speak with your caregiver about any limitations on activity for underlying diseases you may have. SEEK MEDICAL CARE IF:   Your condition is not improving after your transfusion.  You develop redness or irritation at the intravenous (IV) site. SEEK IMMEDIATE MEDICAL CARE IF:  Any of the following symptoms occur over the next 12 hours:  Shaking chills.  You have a temperature by mouth above 102 F (38.9 C), not controlled by medicine.  Chest, back, or muscle pain.  People around you feel you are not acting correctly or are confused.  Shortness of breath or difficulty breathing.  Dizziness and fainting.  You get a rash or develop hives.  You have a decrease in urine output.  Your urine turns a dark color or changes to pink, red, or brown. Any of the following symptoms occur over the next 10 days:  You have a temperature by mouth above 102 F (38.9 C), not controlled by medicine.  Shortness of breath.  Weakness after normal activity.  The white part of the eye turns yellow (jaundice).  You have a decrease in the amount of urine or are urinating less often.  Your urine turns a dark color or changes to pink, red, or brown. Document Released: 10/30/2000 Document Revised: 01/25/2012 Document Reviewed: 06/18/2008 [facility] Patient Information 2014 [place], [place].  _______________________________________________________________________

## 2020-02-27 ENCOUNTER — Encounter (HOSPITAL_COMMUNITY)
Admission: RE | Admit: 2020-02-27 | Discharge: 2020-02-27 | Disposition: A | Discharge status: Home / Self Care | Payer: BC Managed Care – PPO | Source: Ambulatory Visit | Attending: Surgery | Admitting: Surgery

## 2020-02-27 ENCOUNTER — Encounter (HOSPITAL_COMMUNITY): Payer: Self-pay

## 2020-02-27 ENCOUNTER — Other Ambulatory Visit: Payer: Self-pay

## 2020-02-27 DIAGNOSIS — Z79899 Other long term (current) drug therapy: Secondary | ICD-10-CM | POA: Insufficient documentation

## 2020-02-27 DIAGNOSIS — Z933 Colostomy status: Secondary | ICD-10-CM | POA: Insufficient documentation

## 2020-02-27 DIAGNOSIS — Z01812 Encounter for preprocedural laboratory examination: Secondary | ICD-10-CM | POA: Insufficient documentation

## 2020-02-27 DIAGNOSIS — Z8616 Personal history of COVID-19: Secondary | ICD-10-CM | POA: Insufficient documentation

## 2020-02-27 HISTORY — DX: Migraine, unspecified, not intractable, without status migrainosus: G43.909

## 2020-02-27 HISTORY — DX: Pneumonitis due to inhalation of food and vomit: J69.0

## 2020-02-27 NOTE — Progress Notes (Addendum)
Has completed the Laural Benes and [place] COVID vaccine 02/20/20  PCP - N/A Cardiologist - N/A  Chest x-ray - N/A EKG - N/A Stress Test - N/A ECHO - N/A Cardiac Cath - N/A  Sleep Study - N/A CPAP - N/A  Fasting Blood Sugar - N/A Checks Blood Sugar _N/A____ times a day  Blood Thinner Instructions:  N/A Aspirin Instructions: N/A Last Dose: N/A  Anesthesia review: N/A  Patient denies shortness of breath, fever, cough and chest pain at PAT appointment   Patient verbalized understanding of instructions that were given to them at the PAT appointment. Patient was also instructed that they will need to review over the PAT instructions again at home before surgery.

## 2020-02-28 ENCOUNTER — Encounter (HOSPITAL_COMMUNITY)
Admission: RE | Admit: 2020-02-28 | Discharge: 2020-02-28 | Disposition: A | Discharge status: Home / Self Care | Payer: BC Managed Care – PPO | Source: Ambulatory Visit | Attending: Surgery | Admitting: Surgery

## 2020-02-28 DIAGNOSIS — Z8616 Personal history of COVID-19: Secondary | ICD-10-CM | POA: Diagnosis not present

## 2020-02-28 DIAGNOSIS — Z79899 Other long term (current) drug therapy: Secondary | ICD-10-CM | POA: Diagnosis not present

## 2020-02-28 DIAGNOSIS — Z933 Colostomy status: Secondary | ICD-10-CM | POA: Diagnosis not present

## 2020-02-28 DIAGNOSIS — Z01812 Encounter for preprocedural laboratory examination: Secondary | ICD-10-CM | POA: Diagnosis present

## 2020-02-28 LAB — CBC WITH DIFFERENTIAL/PLATELET
Abs Immature Granulocytes: 0.04 10*3/uL (ref 0.00–0.07)
Basophils Absolute: 0.1 10*3/uL (ref 0.0–0.1)
Basophils Relative: 1 %
Eosinophils Absolute: 0.1 10*3/uL (ref 0.0–0.5)
Eosinophils Relative: 2 %
HCT: 49.9 % (ref 39.0–52.0)
Hemoglobin: 16.7 g/dL (ref 13.0–17.0)
Immature Granulocytes: 1 %
Lymphocytes Relative: 40 %
Lymphs Abs: 2.6 10*3/uL (ref 0.7–4.0)
MCH: 31.2 pg (ref 26.0–34.0)
MCHC: 33.5 g/dL (ref 30.0–36.0)
MCV: 93.3 fL (ref 80.0–100.0)
Monocytes Absolute: 0.4 10*3/uL (ref 0.1–1.0)
Monocytes Relative: 6 %
Neutro Abs: 3.3 10*3/uL (ref 1.7–7.7)
Neutrophils Relative %: 50 %
Platelets: 251 10*3/uL (ref 150–400)
RBC: 5.35 MIL/uL (ref 4.22–5.81)
RDW: 11.9 % (ref 11.5–15.5)
WBC: 6.5 10*3/uL (ref 4.0–10.5)
nRBC: 0 % (ref 0.0–0.2)

## 2020-02-28 LAB — COMPREHENSIVE METABOLIC PANEL
ALT: 100 U/L — ABNORMAL HIGH (ref 0–44)
AST: 47 U/L — ABNORMAL HIGH (ref 15–41)
Albumin: 4.6 g/dL (ref 3.5–5.0)
Alkaline Phosphatase: 60 U/L (ref 38–126)
Anion gap: 13 (ref 5–15)
BUN: 15 mg/dL (ref 6–20)
CO2: 21 mmol/L — ABNORMAL LOW (ref 22–32)
Calcium: 9.1 mg/dL (ref 8.9–10.3)
Chloride: 108 mmol/L (ref 98–111)
Creatinine, Ser: 0.86 mg/dL (ref 0.61–1.24)
GFR calc Af Amer: >60 mL/min (ref >60)
GFR calc non Af Amer: >60 mL/min (ref >60)
Glucose, Bld: 130 mg/dL — ABNORMAL HIGH (ref 70–99)
Potassium: 4.5 mmol/L (ref 3.5–5.1)
Sodium: 142 mmol/L (ref 135–145)
Total Bilirubin: 1 mg/dL (ref 0.3–1.2)
Total Protein: 8 g/dL (ref 6.5–8.1)

## 2020-02-28 LAB — ABO/RH: ABO/RH(D): B POS

## 2020-02-28 LAB — APTT: aPTT: 27 seconds (ref 24–36)

## 2020-02-28 LAB — PROTIME-INR
INR: 1 (ref 0.8–1.2)
Prothrombin Time: 12.8 seconds (ref 11.4–15.2)

## 2020-02-29 LAB — HEMOGLOBIN A1C
Hgb A1c MFr Bld: 5.5 % (ref 4.8–5.6)
Mean Plasma Glucose: 111 mg/dL

## 2020-03-02 ENCOUNTER — Other Ambulatory Visit (HOSPITAL_COMMUNITY)
Admission: RE | Admit: 2020-03-02 | Discharge: 2020-03-02 | Disposition: A | Discharge status: Home / Self Care | Payer: BC Managed Care – PPO | Source: Ambulatory Visit | Attending: Surgery | Admitting: Surgery

## 2020-03-02 DIAGNOSIS — Z20822 Contact with and (suspected) exposure to covid-19: Secondary | ICD-10-CM | POA: Diagnosis not present

## 2020-03-02 DIAGNOSIS — Z01812 Encounter for preprocedural laboratory examination: Secondary | ICD-10-CM | POA: Diagnosis not present

## 2020-03-02 LAB — SARS CORONAVIRUS 2 (TAT 6-24 HRS): SARS Coronavirus 2: NEGATIVE

## 2020-03-05 MED ORDER — BUPIVACAINE LIPOSOME 1.3 % IJ SUSP
20.0000 mL | Freq: Once | INTRAMUSCULAR | Status: DC
  Filled 2020-03-05: qty 20

## 2020-03-05 NOTE — Anesthesia Preprocedure Evaluation (Addendum)
Anesthesia Evaluation  Patient identified by MRN, date of birth, ID band Patient awake    Reviewed: Allergy & Precautions, NPO status , Patient's Chart, lab work & pertinent test results  History of Anesthesia Complications Negative for: history of anesthetic complications  Airway Mallampati: I  TM Distance: >3 FB Neck ROM: Full    Dental  (+) Dental Advisory Given, Teeth Intact   Pulmonary neg pulmonary ROS,    Pulmonary exam normal        Cardiovascular negative cardio ROS Normal cardiovascular exam     Neuro/Psych  Headaches, negative psych ROS   GI/Hepatic Neg liver ROS,  Diverticulitis with perforation s/p colostomy    Endo/Other  negative endocrine ROS  Renal/GU negative Renal ROS     Musculoskeletal negative musculoskeletal ROS (+)   Abdominal   Peds  Hematology negative hematology ROS (+)   Anesthesia Other Findings Covid neg 4/17 (had Covid 09/2019)  Reproductive/Obstetrics                            Anesthesia Physical Anesthesia Plan  ASA: II  Anesthesia Plan: General   Post-op Pain Management:    Induction: Intravenous  PONV Risk Score and Plan: 4 or greater and Treatment may vary due to age or medical condition, Ondansetron, Dexamethasone and Midazolam  Airway Management Planned: Oral ETT  Additional Equipment: None  Intra-op Plan:   Post-operative Plan: Extubation in OR  Informed Consent: I have reviewed the patients History and Physical, chart, labs and discussed the procedure including the risks, benefits and alternatives for the proposed anesthesia with the patient or authorized representative who has indicated his/her understanding and acceptance.     Dental advisory given  Plan Discussed with: CRNA and Anesthesiologist  Anesthesia Plan Comments:        Anesthesia Quick Evaluation

## 2020-03-06 ENCOUNTER — Inpatient Hospital Stay (HOSPITAL_COMMUNITY): Payer: BC Managed Care – PPO | Admitting: Anesthesiology

## 2020-03-06 ENCOUNTER — Encounter (HOSPITAL_COMMUNITY)
Admission: RE | Disposition: A | Discharge status: Home / Self Care | Payer: Self-pay | Source: Home / Self Care | Attending: Surgery

## 2020-03-06 ENCOUNTER — Inpatient Hospital Stay (HOSPITAL_COMMUNITY)
Admission: RE | Admit: 2020-03-06 | Discharge: 2020-03-08 | DRG: 330 | Disposition: A | Discharge status: Home / Self Care | Payer: BC Managed Care – PPO | Attending: Surgery | Admitting: Surgery

## 2020-03-06 ENCOUNTER — Other Ambulatory Visit: Payer: Self-pay

## 2020-03-06 ENCOUNTER — Encounter (HOSPITAL_COMMUNITY): Payer: Self-pay | Admitting: Surgery

## 2020-03-06 ENCOUNTER — Inpatient Hospital Stay (HOSPITAL_COMMUNITY): Payer: BC Managed Care – PPO

## 2020-03-06 DIAGNOSIS — K660 Peritoneal adhesions (postprocedural) (postinfection): Secondary | ICD-10-CM | POA: Diagnosis present

## 2020-03-06 DIAGNOSIS — Z8719 Personal history of other diseases of the digestive system: Secondary | ICD-10-CM

## 2020-03-06 DIAGNOSIS — Z9889 Other specified postprocedural states: Secondary | ICD-10-CM

## 2020-03-06 DIAGNOSIS — Z8616 Personal history of COVID-19: Secondary | ICD-10-CM | POA: Diagnosis not present

## 2020-03-06 DIAGNOSIS — Q438 Other specified congenital malformations of intestine: Secondary | ICD-10-CM | POA: Diagnosis not present

## 2020-03-06 DIAGNOSIS — Z833 Family history of diabetes mellitus: Secondary | ICD-10-CM

## 2020-03-06 DIAGNOSIS — Z433 Encounter for attention to colostomy: Secondary | ICD-10-CM | POA: Diagnosis present

## 2020-03-06 HISTORY — PX: CYSTOSCOPY WITH STENT PLACEMENT: SHX5790

## 2020-03-06 HISTORY — PX: FLEXIBLE SIGMOIDOSCOPY: SHX5431

## 2020-03-06 HISTORY — PX: COLOSTOMY TAKEDOWN: SHX5258

## 2020-03-06 LAB — CBC
HCT: 47.9 % (ref 39.0–52.0)
Hemoglobin: 16.4 g/dL (ref 13.0–17.0)
MCH: 31.8 pg (ref 26.0–34.0)
MCHC: 34.2 g/dL (ref 30.0–36.0)
MCV: 92.8 fL (ref 80.0–100.0)
Platelets: 272 10*3/uL (ref 150–400)
RBC: 5.16 MIL/uL (ref 4.22–5.81)
RDW: 11.9 % (ref 11.5–15.5)
WBC: 16.1 10*3/uL — ABNORMAL HIGH (ref 4.0–10.5)
nRBC: 0 % (ref 0.0–0.2)

## 2020-03-06 LAB — TYPE AND SCREEN
ABO/RH(D): B POS
Antibody Screen: NEGATIVE

## 2020-03-06 LAB — CREATININE, SERUM
Creatinine, Ser: 1.2 mg/dL (ref 0.61–1.24)
GFR calc Af Amer: >60 mL/min (ref >60)
GFR calc non Af Amer: >60 mL/min (ref >60)

## 2020-03-06 SURGERY — CLOSURE, COLOSTOMY, LAPAROSCOPIC
Anesthesia: General | Site: Abdomen

## 2020-03-06 MED ORDER — BISACODYL 5 MG PO TBEC: 20.0000 mg | DELAYED_RELEASE_TABLET | Freq: Once | ORAL | Status: DC

## 2020-03-06 MED ORDER — ONDANSETRON HCL 4 MG/2ML IJ SOLN
INTRAMUSCULAR | Status: AC
  Filled 2020-03-06: qty 2

## 2020-03-06 MED ORDER — HEPARIN SODIUM (PORCINE) 5000 UNIT/ML IJ SOLN
5000.0000 [IU] | Freq: Three times a day (TID) | INTRAMUSCULAR | Status: DC
  Administered 2020-03-06 – 2020-03-08 (×5): 5000 [IU] via SUBCUTANEOUS
  Filled 2020-03-06 (×5): qty 1

## 2020-03-06 MED ORDER — FENTANYL CITRATE (PF) 250 MCG/5ML IJ SOLN
INTRAMUSCULAR | Status: AC
  Filled 2020-03-06: qty 5

## 2020-03-06 MED ORDER — METRONIDAZOLE 500 MG PO TABS: 1000.0000 mg | ORAL_TABLET | ORAL | Status: DC

## 2020-03-06 MED ORDER — SODIUM CHLORIDE (PF) 0.9 % IJ SOLN
INTRAMUSCULAR | Status: DC | PRN
  Administered 2020-03-06: 15 mL

## 2020-03-06 MED ORDER — PHENYLEPHRINE 40 MCG/ML (10ML) SYRINGE FOR IV PUSH (FOR BLOOD PRESSURE SUPPORT)
PREFILLED_SYRINGE | INTRAVENOUS | Status: AC
  Filled 2020-03-06: qty 10

## 2020-03-06 MED ORDER — FENTANYL CITRATE (PF) 100 MCG/2ML IJ SOLN
25.0000 ug | INTRAMUSCULAR | Status: DC | PRN
  Administered 2020-03-06: 50 ug via INTRAVENOUS

## 2020-03-06 MED ORDER — SODIUM CHLORIDE (PF) 0.9 % IJ SOLN
INTRAMUSCULAR | Status: AC
  Filled 2020-03-06: qty 20

## 2020-03-06 MED ORDER — ALBUMIN HUMAN 5 % IV SOLN: INTRAVENOUS | Status: DC | PRN

## 2020-03-06 MED ORDER — HYDROMORPHONE HCL 1 MG/ML IJ SOLN: 0.5000 mg | INTRAMUSCULAR | Status: DC | PRN

## 2020-03-06 MED ORDER — LIDOCAINE 2% (20 MG/ML) 5 ML SYRINGE
INTRAMUSCULAR | Status: DC | PRN
  Administered 2020-03-06: 1.5 mg/kg/h via INTRAVENOUS

## 2020-03-06 MED ORDER — ROCURONIUM BROMIDE 10 MG/ML (PF) SYRINGE
PREFILLED_SYRINGE | INTRAVENOUS | Status: AC
  Filled 2020-03-06: qty 10

## 2020-03-06 MED ORDER — FENTANYL CITRATE (PF) 100 MCG/2ML IJ SOLN
INTRAMUSCULAR | Status: AC
  Filled 2020-03-06: qty 2

## 2020-03-06 MED ORDER — LACTATED RINGERS IV SOLN: INTRAVENOUS | Status: DC

## 2020-03-06 MED ORDER — ALUM & MAG HYDROXIDE-SIMETH 200-200-20 MG/5ML PO SUSP: 30.0000 mL | Freq: Four times a day (QID) | ORAL | Status: DC | PRN

## 2020-03-06 MED ORDER — ALVIMOPAN 12 MG PO CAPS
12.0000 mg | ORAL_CAPSULE | ORAL | Status: AC
  Administered 2020-03-06: 12 mg via ORAL
  Filled 2020-03-06: qty 1

## 2020-03-06 MED ORDER — PHENYLEPHRINE HCL-NACL 10-0.9 MG/250ML-% IV SOLN
INTRAVENOUS | Status: DC | PRN
  Administered 2020-03-06: 50 ug/min via INTRAVENOUS

## 2020-03-06 MED ORDER — BUPIVACAINE LIPOSOME 1.3 % IJ SUSP
INTRAMUSCULAR | Status: DC | PRN
  Administered 2020-03-06: 20 mL

## 2020-03-06 MED ORDER — ROCURONIUM BROMIDE 10 MG/ML (PF) SYRINGE
PREFILLED_SYRINGE | INTRAVENOUS | Status: DC | PRN
  Administered 2020-03-06: 60 mg via INTRAVENOUS

## 2020-03-06 MED ORDER — PROPOFOL 10 MG/ML IV BOLUS
INTRAVENOUS | Status: AC
  Filled 2020-03-06: qty 20

## 2020-03-06 MED ORDER — LIDOCAINE 2% (20 MG/ML) 5 ML SYRINGE
INTRAMUSCULAR | Status: AC
  Filled 2020-03-06: qty 5

## 2020-03-06 MED ORDER — LIDOCAINE 2% (20 MG/ML) 5 ML SYRINGE
INTRAMUSCULAR | Status: DC | PRN
  Administered 2020-03-06: 20 mg via INTRAVENOUS
  Administered 2020-03-06: 40 mg via INTRAVENOUS
  Administered 2020-03-06 (×2): 20 mg via INTRAVENOUS
  Administered 2020-03-06: 60 mg via INTRAVENOUS
  Administered 2020-03-06: 20 mg via INTRAVENOUS

## 2020-03-06 MED ORDER — PHENYLEPHRINE HCL (PRESSORS) 10 MG/ML IV SOLN
INTRAVENOUS | Status: AC
  Filled 2020-03-06: qty 1

## 2020-03-06 MED ORDER — MIDAZOLAM HCL 2 MG/2ML IJ SOLN
INTRAMUSCULAR | Status: DC | PRN
  Administered 2020-03-06: 2 mg via INTRAVENOUS

## 2020-03-06 MED ORDER — ACETAMINOPHEN 500 MG PO TABS
1000.0000 mg | ORAL_TABLET | ORAL | Status: AC
  Administered 2020-03-06: 1000 mg via ORAL
  Filled 2020-03-06: qty 2

## 2020-03-06 MED ORDER — NEOMYCIN SULFATE 500 MG PO TABS: 1000.0000 mg | ORAL_TABLET | ORAL | Status: DC

## 2020-03-06 MED ORDER — MIDAZOLAM HCL 2 MG/2ML IJ SOLN
INTRAMUSCULAR | Status: AC
  Filled 2020-03-06: qty 2

## 2020-03-06 MED ORDER — DIPHENHYDRAMINE HCL 12.5 MG/5ML PO ELIX: 12.5000 mg | ORAL_SOLUTION | Freq: Four times a day (QID) | ORAL | Status: DC | PRN

## 2020-03-06 MED ORDER — PHENYLEPHRINE 40 MCG/ML (10ML) SYRINGE FOR IV PUSH (FOR BLOOD PRESSURE SUPPORT)
PREFILLED_SYRINGE | INTRAVENOUS | Status: DC | PRN
  Administered 2020-03-06: 120 ug via INTRAVENOUS
  Administered 2020-03-06: 80 ug via INTRAVENOUS
  Administered 2020-03-06 (×3): 120 ug via INTRAVENOUS

## 2020-03-06 MED ORDER — LIDOCAINE HCL 2 % IJ SOLN
INTRAMUSCULAR | Status: AC
  Filled 2020-03-06: qty 20

## 2020-03-06 MED ORDER — ONDANSETRON HCL 4 MG/2ML IJ SOLN: 4.0000 mg | Freq: Four times a day (QID) | INTRAMUSCULAR | Status: DC | PRN

## 2020-03-06 MED ORDER — IOHEXOL 300 MG/ML  SOLN
INTRAMUSCULAR | Status: DC | PRN
  Administered 2020-03-06: 11 mL via URETHRAL

## 2020-03-06 MED ORDER — DEXAMETHASONE SODIUM PHOSPHATE 10 MG/ML IJ SOLN
INTRAMUSCULAR | Status: DC | PRN
  Administered 2020-03-06: 10 mg via INTRAVENOUS

## 2020-03-06 MED ORDER — ALBUMIN HUMAN 5 % IV SOLN
INTRAVENOUS | Status: AC
  Filled 2020-03-06: qty 250

## 2020-03-06 MED ORDER — DEXAMETHASONE SODIUM PHOSPHATE 10 MG/ML IJ SOLN
INTRAMUSCULAR | Status: AC
  Filled 2020-03-06: qty 1

## 2020-03-06 MED ORDER — SODIUM CHLORIDE 0.9 % IR SOLN
Status: DC | PRN
  Administered 2020-03-06: 1000 mL

## 2020-03-06 MED ORDER — PROPOFOL 10 MG/ML IV BOLUS
INTRAVENOUS | Status: DC | PRN
  Administered 2020-03-06: 200 mg via INTRAVENOUS

## 2020-03-06 MED ORDER — OXYCODONE HCL 5 MG/5ML PO SOLN: 5.0000 mg | Freq: Once | ORAL | Status: DC | PRN

## 2020-03-06 MED ORDER — FENTANYL CITRATE (PF) 250 MCG/5ML IJ SOLN
INTRAMUSCULAR | Status: DC | PRN
  Administered 2020-03-06 (×7): 50 ug via INTRAVENOUS

## 2020-03-06 MED ORDER — BUPIVACAINE-EPINEPHRINE (PF) 0.5% -1:200000 IJ SOLN
INTRAMUSCULAR | Status: AC
  Filled 2020-03-06: qty 30

## 2020-03-06 MED ORDER — SUGAMMADEX SODIUM 200 MG/2ML IV SOLN
INTRAVENOUS | Status: DC | PRN
  Administered 2020-03-06: 200 mg via INTRAVENOUS

## 2020-03-06 MED ORDER — IBUPROFEN 200 MG PO TABS: 600.0000 mg | ORAL_TABLET | Freq: Four times a day (QID) | ORAL | Status: DC | PRN

## 2020-03-06 MED ORDER — ONDANSETRON HCL 4 MG PO TABS: 4.0000 mg | ORAL_TABLET | Freq: Four times a day (QID) | ORAL | Status: DC | PRN

## 2020-03-06 MED ORDER — POLYETHYLENE GLYCOL 3350 17 GM/SCOOP PO POWD: 1.0000 | Freq: Once | ORAL | Status: DC

## 2020-03-06 MED ORDER — ALVIMOPAN 12 MG PO CAPS
12.0000 mg | ORAL_CAPSULE | Freq: Two times a day (BID) | ORAL | Status: DC
  Administered 2020-03-07 (×2): 12 mg via ORAL
  Filled 2020-03-06 (×2): qty 1

## 2020-03-06 MED ORDER — BUPIVACAINE-EPINEPHRINE 0.5% -1:200000 IJ SOLN
INTRAMUSCULAR | Status: DC | PRN
  Administered 2020-03-06: 15 mL

## 2020-03-06 MED ORDER — LACTATED RINGERS IR SOLN
Status: DC | PRN
  Administered 2020-03-06: 1000 mL

## 2020-03-06 MED ORDER — ACETAMINOPHEN 500 MG PO TABS
1000.0000 mg | ORAL_TABLET | Freq: Four times a day (QID) | ORAL | Status: DC
  Administered 2020-03-06 – 2020-03-08 (×7): 1000 mg via ORAL
  Filled 2020-03-06 (×7): qty 2

## 2020-03-06 MED ORDER — DIPHENHYDRAMINE HCL 50 MG/ML IJ SOLN: 12.5000 mg | Freq: Four times a day (QID) | INTRAMUSCULAR | Status: DC | PRN

## 2020-03-06 MED ORDER — CHLORHEXIDINE GLUCONATE CLOTH 2 % EX PADS: 6.0000 | MEDICATED_PAD | Freq: Once | CUTANEOUS | Status: DC

## 2020-03-06 MED ORDER — PROMETHAZINE HCL 25 MG/ML IJ SOLN: 6.2500 mg | INTRAMUSCULAR | Status: DC | PRN

## 2020-03-06 MED ORDER — ONDANSETRON HCL 4 MG/2ML IJ SOLN
INTRAMUSCULAR | Status: DC | PRN
  Administered 2020-03-06: 4 mg via INTRAVENOUS

## 2020-03-06 MED ORDER — KETAMINE HCL 10 MG/ML IJ SOLN
INTRAMUSCULAR | Status: DC | PRN
  Administered 2020-03-06: 50 mg via INTRAVENOUS

## 2020-03-06 MED ORDER — KETAMINE HCL 10 MG/ML IJ SOLN
INTRAMUSCULAR | Status: AC
  Filled 2020-03-06: qty 1

## 2020-03-06 MED ORDER — HEPARIN SODIUM (PORCINE) 5000 UNIT/ML IJ SOLN
5000.0000 [IU] | Freq: Once | INTRAMUSCULAR | Status: AC
  Administered 2020-03-06: 5000 [IU] via SUBCUTANEOUS
  Filled 2020-03-06: qty 1

## 2020-03-06 MED ORDER — ENSURE SURGERY PO LIQD
237.0000 mL | Freq: Two times a day (BID) | ORAL | Status: DC
  Administered 2020-03-07 – 2020-03-08 (×2): 237 mL via ORAL
  Filled 2020-03-06 (×5): qty 237

## 2020-03-06 MED ORDER — SODIUM CHLORIDE 0.9 % IV SOLN
2.0000 g | INTRAVENOUS | Status: AC
  Administered 2020-03-06: 2 g via INTRAVENOUS
  Filled 2020-03-06: qty 2

## 2020-03-06 MED ORDER — 0.9 % SODIUM CHLORIDE (POUR BTL) OPTIME
TOPICAL | Status: DC | PRN
  Administered 2020-03-06: 1000 mL

## 2020-03-06 MED ORDER — TRAMADOL HCL 50 MG PO TABS: 50.0000 mg | ORAL_TABLET | Freq: Four times a day (QID) | ORAL | Status: DC | PRN

## 2020-03-06 MED ORDER — OXYCODONE HCL 5 MG PO TABS: 5.0000 mg | ORAL_TABLET | Freq: Once | ORAL | Status: DC | PRN

## 2020-03-06 SURGICAL SUPPLY — 101 items
ADAPTER GOLDBERG URETERAL (ADAPTER) ×5 IMPLANT
APPLIER CLIP 5 13 M/L LIGAMAX5 (MISCELLANEOUS)
APPLIER CLIP ROT 10 11.4 M/L (STAPLE)
BAG URO CATCHER STRL LF (MISCELLANEOUS) ×5 IMPLANT
BLADE EXTENDED COATED 6.5IN (ELECTRODE) IMPLANT
CABLE HIGH FREQUENCY MONO STRZ (ELECTRODE) ×5 IMPLANT
CATH INTERMIT  6FR 70CM (CATHETERS) ×10 IMPLANT
CATH MUSHROOM 28FR (CATHETERS) IMPLANT
CATH MUSHROOM 30FR (CATHETERS) IMPLANT
CELLS DAT CNTRL 66122 CELL SVR (MISCELLANEOUS) IMPLANT
CLIP APPLIE 5 13 M/L LIGAMAX5 (MISCELLANEOUS) IMPLANT
CLIP APPLIE ROT 10 11.4 M/L (STAPLE) IMPLANT
CLOTH BEACON ORANGE TIMEOUT ST (SAFETY) ×5 IMPLANT
COVER WAND RF STERILE (DRAPES) IMPLANT
DECANTER SPIKE VIAL GLASS SM (MISCELLANEOUS) ×15 IMPLANT
DERMABOND ADVANCED (GAUZE/BANDAGES/DRESSINGS) ×2
DERMABOND ADVANCED .7 DNX12 (GAUZE/BANDAGES/DRESSINGS) ×3 IMPLANT
DISSECTOR BLUNT TIP ENDO 5MM (MISCELLANEOUS) IMPLANT
DRAIN CHANNEL 19F RND (DRAIN) IMPLANT
DRAPE SURG IRRIG POUCH 19X23 (DRAPES) ×5 IMPLANT
DRSG OPSITE POSTOP 4X10 (GAUZE/BANDAGES/DRESSINGS) IMPLANT
DRSG OPSITE POSTOP 4X6 (GAUZE/BANDAGES/DRESSINGS) ×5 IMPLANT
DRSG OPSITE POSTOP 4X8 (GAUZE/BANDAGES/DRESSINGS) IMPLANT
DRSG PAD ABDOMINAL 8X10 ST (GAUZE/BANDAGES/DRESSINGS) ×5 IMPLANT
DRSG TEGADERM 4X4.75 (GAUZE/BANDAGES/DRESSINGS) ×5 IMPLANT
ELECT REM PT RETURN 15FT ADLT (MISCELLANEOUS) ×5 IMPLANT
EVACUATOR SILICONE 100CC (DRAIN) IMPLANT
GAUZE 4X4 16PLY RFD (DISPOSABLE) ×5 IMPLANT
GAUZE SPONGE 4X4 12PLY STRL (GAUZE/BANDAGES/DRESSINGS) ×5 IMPLANT
GLOVE BIO SURGEON STRL SZ 6.5 (GLOVE) ×8 IMPLANT
GLOVE BIO SURGEON STRL SZ7.5 (GLOVE) ×10 IMPLANT
GLOVE BIO SURGEONS STRL SZ 6.5 (GLOVE) ×2
GLOVE BIOGEL PI IND STRL 6.5 (GLOVE) ×6 IMPLANT
GLOVE BIOGEL PI IND STRL 7.0 (GLOVE) ×6 IMPLANT
GLOVE BIOGEL PI INDICATOR 6.5 (GLOVE) ×4
GLOVE BIOGEL PI INDICATOR 7.0 (GLOVE) ×4
GLOVE ECLIPSE 6.5 STRL STRAW (GLOVE) ×10 IMPLANT
GLOVE ECLIPSE 8.0 STRL XLNG CF (GLOVE) ×10 IMPLANT
GLOVE INDICATOR 8.0 STRL GRN (GLOVE) ×20 IMPLANT
GLOVE SURG SS PI 7.0 STRL IVOR (GLOVE) ×5 IMPLANT
GOWN STRL REUS W/TWL LRG LVL3 (GOWN DISPOSABLE) ×10 IMPLANT
GOWN STRL REUS W/TWL XL LVL3 (GOWN DISPOSABLE) ×35 IMPLANT
GUIDEWIRE STR DUAL SENSOR (WIRE) ×5 IMPLANT
HOLDER FOLEY CATH W/STRAP (MISCELLANEOUS) ×5 IMPLANT
KIT PROCEDURE OLYMPUS (MISCELLANEOUS) ×5 IMPLANT
KIT TURNOVER KIT A (KITS) IMPLANT
LIGASURE IMPACT 36 18CM CVD LR (INSTRUMENTS) IMPLANT
MANIFOLD NEPTUNE II (INSTRUMENTS) ×5 IMPLANT
NEEDLE INSUFFLATION 14GA 120MM (NEEDLE) ×5 IMPLANT
PAD POSITIONING PINK XL (MISCELLANEOUS) ×5 IMPLANT
PENCIL SMOKE EVACUATOR (MISCELLANEOUS) IMPLANT
PORT LAP GEL ALEXIS MED 5-9CM (MISCELLANEOUS) ×5 IMPLANT
RELOAD PROXIMATE 75MM BLUE (ENDOMECHANICALS) IMPLANT
RELOAD STAPLER GREEN 60MM (STAPLE) ×3 IMPLANT
RTRCTR WOUND ALEXIS 18CM MED (MISCELLANEOUS)
SCISSORS LAP 5X35 DISP (ENDOMECHANICALS) ×5 IMPLANT
SEALER TISSUE G2 STRG ARTC 35C (ENDOMECHANICALS) ×5 IMPLANT
SET IRRIG TUBING LAPAROSCOPIC (IRRIGATION / IRRIGATOR) ×5 IMPLANT
SET TUBE SMOKE EVAC HIGH FLOW (TUBING) ×5 IMPLANT
SLEEVE ADV FIXATION 5X100MM (TROCAR) ×10 IMPLANT
SPONGE DRAIN TRACH 4X4 STRL 2S (GAUZE/BANDAGES/DRESSINGS) IMPLANT
SPONGE LAP 18X18 RF (DISPOSABLE) ×5 IMPLANT
STAPLER ECHELON LONG 60 440 (INSTRUMENTS) ×5 IMPLANT
STAPLER ECHELON POWER CIR 29 (STAPLE) ×5 IMPLANT
STAPLER GUN LINEAR PROX 60 (STAPLE) IMPLANT
STAPLER PROXIMATE 75MM BLUE (STAPLE) IMPLANT
STAPLER RELOAD GREEN 60MM (STAPLE) ×5
STAPLER VISISTAT 35W (STAPLE) IMPLANT
SUT ETHILON 3 0 PS 1 (SUTURE) IMPLANT
SUT PDS AB 1 CTX 36 (SUTURE) IMPLANT
SUT PDS AB 1 TP1 54 (SUTURE) IMPLANT
SUT PDS AB 1 TP1 96 (SUTURE) IMPLANT
SUT PROLENE 2 0 KS (SUTURE) ×10 IMPLANT
SUT PROLENE 2 0 SH DA (SUTURE) ×5 IMPLANT
SUT SILK 2 0 (SUTURE) ×2
SUT SILK 2 0 SH CR/8 (SUTURE) ×5 IMPLANT
SUT SILK 2-0 18XBRD TIE 12 (SUTURE) ×3 IMPLANT
SUT SILK 3 0 (SUTURE) ×2
SUT SILK 3 0 SH CR/8 (SUTURE) ×10 IMPLANT
SUT SILK 3-0 18XBRD TIE 12 (SUTURE) ×3 IMPLANT
SUT VIC AB 2-0 SH 27 (SUTURE)
SUT VIC AB 2-0 SH 27X BRD (SUTURE) IMPLANT
SUT VIC AB 3-0 SH 18 (SUTURE) IMPLANT
SUT VIC AB 3-0 SH 27 (SUTURE)
SUT VIC AB 3-0 SH 27X BRD (SUTURE) IMPLANT
SUT VICRYL 2 0 18  UND BR (SUTURE) ×2
SUT VICRYL 2 0 18 UND BR (SUTURE) ×3 IMPLANT
SYS LAPSCP GELPORT 120MM (MISCELLANEOUS)
SYSTEM LAPSCP GELPORT 120MM (MISCELLANEOUS) IMPLANT
TOWEL OR 17X26 10 PK STRL BLUE (TOWEL DISPOSABLE) IMPLANT
TOWEL OR NON WOVEN STRL DISP B (DISPOSABLE) ×5 IMPLANT
TRAY COLON PACK (CUSTOM PROCEDURE TRAY) ×5 IMPLANT
TRAY CYSTO PACK (CUSTOM PROCEDURE TRAY) ×5 IMPLANT
TRAY FOLEY MTR SLVR 16FR STAT (SET/KITS/TRAYS/PACK) ×5 IMPLANT
TROCAR ADV FIXATION 5X100MM (TROCAR) ×5 IMPLANT
TROCAR BLADELESS OPT 5 100 (ENDOMECHANICALS) ×5 IMPLANT
TROCAR XCEL BLUNT TIP 100MML (ENDOMECHANICALS) IMPLANT
TUBING CONNECTING 10 (TUBING) ×8 IMPLANT
TUBING CONNECTING 10' (TUBING) ×2
TUBING ENDO SMARTCAP CO2 EXT (MISCELLANEOUS) ×5 IMPLANT
TUBING UROLOGY SET (TUBING) IMPLANT

## 2020-03-06 NOTE — Anesthesia Procedure Notes (Signed)
Procedure Name: Intubation Date/Time: 03/06/2020 8:31 AM Performed by: Florene Route, CRNA Patient Re-evaluated:Patient Re-evaluated prior to induction Oxygen Delivery Method: Circle system utilized Preoxygenation: Pre-oxygenation with 100% oxygen Induction Type: IV induction Ventilation: Mask ventilation without difficulty and Oral airway inserted - appropriate to patient size Laryngoscope Size: Miller and 3 Grade View: Grade I Tube type: Oral Tube size: 8.0 mm Number of attempts: 1 Airway Equipment and Method: Stylet Placement Confirmation: ETT inserted through vocal cords under direct vision,  positive ETCO2 and breath sounds checked- equal and bilateral Secured at: 22 cm Tube secured with: Tape Dental Injury: Teeth and Oropharynx as per pre-operative assessment

## 2020-03-06 NOTE — Plan of Care (Signed)
  Problem: Education: Goal: Knowledge of General Education information will improve Description: Including pain rating scale, medication(s)/side effects and non-pharmacologic comfort measures Outcome: Progressing   Problem: Clinical Measurements: Goal: Ability to maintain clinical measurements within normal limits will improve Outcome: Progressing   Problem: Activity: Goal: Risk for activity intolerance will decrease Outcome: Progressing   Problem: Nutrition: Goal: Adequate nutrition will be maintained Outcome: Progressing   Problem: Elimination: Goal: Will not experience complications related to urinary retention Outcome: Progressing   Problem: Pain Managment: Goal: General experience of comfort will improve Outcome: Progressing   

## 2020-03-06 NOTE — Progress Notes (Signed)
Pt pulse has been tachy in PACU MD aware not an acute change.Marland Kitchen

## 2020-03-06 NOTE — H&P (Signed)
H&P Physician requesting consult: Marin Olp  Chief Complaint: Request for ureteral stents  History of Present Illness: 34 year old male with Hartman's pouch here for reversal.  He originally had a pouch placed for perforated diverticulitis.  Intraoperative stent placement bilaterally is requested  Past Medical History:  Diagnosis Date  . Aspiration pneumonia (HCC)    childhood  . Colon perforation (HCC) 02/2019  . Diverticulitis   . History of COVID-19 09/2019  . Migraines    Past Surgical History:  Procedure Laterality Date  . COLON SURGERY    . COLONOSCOPY  07/06/2019  . LAPAROTOMY N/A 03/05/2019   Procedure: EXPLORATORY LAPAROTOMY;  Surgeon: Harriette Bouillon, MD;  Location: [facility] OR;  Service: General;  Laterality: N/A;  . LUNG SURGERY     remove peanut aspiration  . WISDOM TOOTH EXTRACTION      Home Medications:  Medications Prior to Admission  Medication Sig Dispense Refill Last Dose  . metroNIDAZOLE (FLAGYL) 500 MG tablet Take 1,000 mg by mouth in the morning, at noon, and at bedtime.   03/05/2020 at Unknown time  . neomycin (MYCIFRADIN) 500 MG tablet Take 1,000 mg by mouth in the morning, at noon, and at bedtime.   03/05/2020 at Unknown time   Allergies: No Known Allergies  Family History  Problem Relation Age of Onset  . Diabetes Maternal Grandmother    Social History:  reports that he has never smoked. He has never used smokeless tobacco. He reports current alcohol use. He reports that he does not use drugs.  ROS: A complete review of systems was performed.  All systems are negative except for pertinent findings as noted. ROS   Physical Exam:  Vital signs in last 24 hours: Weight:  [83.1 kg] 83.1 kg (04/21 0652) General:  Alert and oriented, No acute distress HEENT: Normocephalic, atraumatic Neck: No JVD or lymphadenopathy Cardiovascular: Regular rate and rhythm Lungs: Regular rate and effort Abdomen: Soft, nontender, nondistended, no abdominal  masses Back: No CVA tenderness Extremities: No edema Neurologic: Grossly intact  Laboratory Data:  No results found for this or any previous visit (from the past 24 hour(s)). Recent Results (from the past 240 hour(s))  SARS CORONAVIRUS 2 (TAT 6-24 HRS) Nasopharyngeal Nasopharyngeal Swab     Status: None   Collection Time: 03/02/20  1:24 PM   Specimen: Nasopharyngeal Swab  Result Value Ref Range Status   SARS Coronavirus 2 NEGATIVE NEGATIVE Final    Comment: (NOTE) SARS-CoV-2 target nucleic acids are NOT DETECTED. The SARS-CoV-2 RNA is generally detectable in upper and lower respiratory specimens during the acute phase of infection. Negative results do not preclude SARS-CoV-2 infection, do not rule out co-infections with other pathogens, and should not be used as the sole basis for treatment or other patient management decisions. Negative results must be combined with clinical observations, patient history, and epidemiological information. The expected result is Negative. Fact Sheet for Patients: HairSlick.no Fact Sheet for Healthcare Providers: quierodirigir.com This test is not yet approved or cleared by the [place] FDA and  has been authorized for detection and/or diagnosis of SARS-CoV-2 by FDA under an Emergency Use Authorization (EUA). This EUA will remain  in effect (meaning this test can be used) for the duration of the COVID-19 declaration under Section 56 4(b)(1) of the Act, 21 U.S.C. section 360bbb-3(b)(1), unless the authorization is terminated or revoked sooner. Performed at [facility], [address], [place], [place] [address]    Creatinine: No results for input(s): CREATININE in the last  168 hours.  Impression/Assessment:  Diverticulitis  Plan:  Proceed with bilateral ureteral stent placement.  Risk and benefits discussed including but not limited to bleeding infection injury to surrounding  structures, need for additional procedures.  Marton Redwood, III 03/06/2020, 8:15 AM

## 2020-03-06 NOTE — H&P (Deleted)
Operative Note  Preoperative diagnosis:  1.  History of diverticulitis status post Hartmann pouch  Post operative diagnosis: 1.  Same  Procedure(s): 1.  Cystoscopy with bilateral retrograde pyelogram and bilateral open-ended ureteral catheter placement  Surgeon: Zaniyah Wernette, MD  Assistants: None  Anesthesia: General  Complications: None immediate  EBL: Minimal  Specimens: 1.  None  Drains/Catheters: 1.  Bilateral open-ended ureteral catheters 2.  Foley catheter  Intraoperative findings: 1.  Normal urethra and bladder 2.  Bilateral retrograde pyelogram revealed normal ureter and kidney without any filling defects  Indication: 33-year-old male with a history of perforated diverticulitis status post Hartmann pouch presents for colostomy reversal.  Intraoperative stent placement was requested.  Description of procedure:  The patient was identified and consent was obtained.  The patient was taken to the operating room and placed in the supine position.  The patient was placed under general anesthesia.  Perioperative antibiotics were administered.  The patient was placed in dorsal lithotomy.  Patient was prepped and draped in a standard sterile fashion and a timeout was performed.  A 21 French rigid cystoscope was advanced into the urethra and into the bladder.  The left distal most portion of the ureter was cannulated with an open-ended ureteral catheter.  Retrograde pyelogram was performed with the findings noted above.  A sensor wire was then advanced up to the kidney under fluoroscopic guidance.  The open-ended ureteral catheter was advanced over the wire up to the kidney and then the scope and wire were withdrawn keeping the open-ended ureteral catheter in place.   The right distal most portion of the ureter was cannulated with an open-ended ureteral catheter.  Retrograde pyelogram was performed with the findings noted above.  A sensor wire was then advanced up to  the kidney under fluoroscopic guidance.  The open-ended ureteral catheter was advanced over the wire up to the kidney and then the scope and wire were withdrawn keeping the open-ended ureteral catheter in place.Foley catheter was placed and both open-ended ureteral catheters were secured and threaded through into a drainage bag.  This concluded my portion of the operation.  Patient tolerated procedure well and the case was handed over to general surgery.  Plan: Per general surgery.  Urology will be available as needed.  

## 2020-03-06 NOTE — Op Note (Signed)
03/06/2020  12:18 PM  PATIENT:  Kyle Moss  34 y.o. male  Patient Care Team: Patient, No Pcp Per as PCP - General (General Practice)  PRE-OPERATIVE DIAGNOSIS: 1. Colostomy status 2. History of severe diverticulitis  POST-OPERATIVE DIAGNOSIS:  Same  PROCEDURE: 1. Laparoscopic colostomy takedown 2. Laparoscopic sigmoidectomy 3. Laparoscopic lysis of adhesions x 45 minutes 4. Flexible sigmoidoscopy 5. Bilateral transversus abdominus plane blocks  SURGEON:  Stephanie Coup. Cliffton Asters, MD  ASSISTANT: Karie Soda, MD  ANESTHESIA:   local and general  COUNTS:  Sponge, needle and instrument counts were reported correct x2 at the conclusion of the operation.  EBL: 125 mL  DRAINS:   SPECIMEN:  1. Rectosigmoid colon 2. Colostomy + additional colon (2 specimens, sent together)  COMPLICATIONS: none  FINDINGS: Omental adhesions to midline as well as colostomy and descending colon. These were carefully lysed. Small bowel adhesions to left abdominal wall lysed - 1 small 1cm deserosalization at this location, which was inherent to the nature of the procedure repaired. Colostomy taken down, mobilized. Sigmoid stump in situ - resected to healthy normal appearing rectum. Anastomosis fashioned at 15 cm from the anal verge by flex sig.   DISPOSITION: PACU in satisfactory condition  INDICATION: Mr. Daughtridge is a very pleasant 34 year old male with history of perforated diverticulitis whom underwent a Hartman's resection by my partner in April 2020.  He had a large perforation that was contained containing stool.  He recovered well from his surgery and was discharged.  There were some concerns intraoperatively for possible Crohn's disease.  He underwent evaluation postoperatively with colonoscopy did not demonstrate any evidence of colitis.  His pathology returned with findings consistent with diverticulitis and associated serositis with perforation.  He had a Gastrografin enema completed as well  that showed a normal-appearing pouch.  There was a fair amount of remaining sigmoid.  Again, no evidence of IBD on colonoscopy which was completed in August 2020.  We discussed options moving forward.  He strongly desired colostomy reversal.  We spent time going over what all this entails.  After discussing everything at length, he opted to proceed.  Please refer to notes elsewhere for details regarding this discussion  DESCRIPTION: The patient was identified in preop holding and taken to the OR where he was placed on the operating room table. SCDs were placed. General endotracheal anesthesia was induced without difficulty.  He was then placed in [place] stirrups.  Pressure points were then padded and verified.  He was then prepped and draped in usual sterile fashion for the urologic portion of the procedure.  Please refer to Dr. Shannan Harper note for details regarding this portion of the procedure.  Hair on the abdomen was clipped.  A stitch was placed in the colostomy to prevent stool draining during the procedure.  He was then prepped and draped in the standard sterile fashion for the abdominal portion of the surgery.  A surgical timeout was performed indicating the correct patient, procedure, positioning and need for preoperative antibiotics.   An orogastric tube had been placed by anesthesia and was confirmed to be on suction.  At Palmer's point, a stab incision was created and the Veress needle introduced into the peritoneal cavity on the first attempt.  Intraperitoneal location was verified with the aspiration and saline drop test.  Pneumoperitoneum was established to a maximum pressure of 15 mmHg.  At Palmer's point, a 5 mm Optiview trocar was used to gain access.  The laparoscope was inserted and inspection demonstrated no evidence  of Veress needle site or trocar site complications.  The abdomen was surveyed.  There were omental adhesions to the midline and around the colostomy.  Under direct visualization, 2  additional 5 mm trochars were placed in the right lateral abdomen.  Bilateral transversus abdominis plane blocks were then created using a dilute mixture of Exparel with Marcaine.  Careful adhesiolysis was then taken out sharply taken the omentum down from the abdominal wall.  After the midline had been cleared of the omental adhesions, an additional 5 mm port was placed in the right mid abdomen.  Adhesiolysis commenced further working down towards the left lower quadrant.  There were some small bowel adhesions to the left abdominal wall that were carefully taken down sharply.  At this point, the omentum was able to be reflected into the upper abdomen medial to the colostomy.  The small bowel had been freed such that it reached into the upper abdomen without coming under the cut edge of the mesentery.  There was 1 small 1 cm deserosalization in the small bowel at the location of the adhesiolysis that was tagged with a suture but at the end (after colostomy takedown), repaired using 3-0 silk Lembert suture.  This was closed transversely.  The repair was inspected and noted to be complete.  Attention was turned to the pelvis.  There was a redundant loop of sigmoid remaining in situ.  This was grasped and elevated anteriorly.  This was mobilized from some flimsy attachments.  The proximal rectum was identified at the location where the tinea splayed and there were no appendices epiploica.  This overlie the sacral promontory.  Using the Enseal device, the sigmoid mesentery was ligated by hugging the sigmoid colon down to the level of the proximalmost rectum.  Attention was turned to takedown the colostomy.  The colostomy was incised circumferentially.  The subcutaneous tissue was dissected away carefully with electrocautery.  This was all done under pneumoperitoneum.  The peritoneal cavity was ultimately able to be entered.  The colostomy was freed from all of its attachments.  This came up nicely into the wound.  A  wound protector was placed at this location.  A healthy location on the descending/sigmoid: Was selected for the site of transection.  The mesentery was cleared out to this level taking care not to interfere with any of the blood supply to the colon.  A pursestring clamp was applied followed by a 2-0 Prolene on a Keith needle.  The colostomy portion was then removed and passed off the specimen.  There was 1 small deserosalization noted just proximal to the pursestring clamp so additional colon was taken and a new pursestring created approximately 2 cm proximal to this location.  EEA sizers were then passed and a 29 mm EEA selected.  3-0 silk "belt loop" sutures were placed along the pursestring line for reinforcement.  The anvil was placed.  The pursestring was tied.  There is no diverticula at the location of the anastomosis or a significant amount of mesentery.  This was pink and well perfused.  This was placed back in the abdomen.  The small bowel lembert sutures were placed at this point in the procedure as noted above.  A wound protector cap was placed and pneumoperitoneum reestablished.  The descending colon was fully mobilized including a portion of the splenic flexure and after having mobilized the colon and associated mesentery medially, this region of the pelvis nicely without any tension.  There were some omental attachments  that were also taken down from the descending colon.  The descending colon was pink and well perfused.  The rectum was reinspected.  The planned point of transection at the location of the tinea had splayed was reidentified.  The mesentery/mesorectum was cleared out to this level.  The rectum was pink and well perfused in appearance.  The rectosigmoid colon was then divided by passing a stapler through our wound protector port site.  A single firing of the 60 mm laparoscopic GIA stapler with a green load was utilized.  This portion was then removed through the wound protector as  additional specimen -this contained the remnant sigmoid.  I then went below.  Under direct visualization, EEA sizers were passed up the rectal stump.  The 29 mm sizer passed without any difficulty.  The stapler was then passed and the spike deployed just anterior to the staple line.  The components were then mated.  Orientation was confirmed such that there is no twisting of the colon.  There is no small bowel to the cut edge of the mesentery.  The stapler was then closed, held, and fired.  The donuts were inspected and noted to be complete.  We then turned our attention to the leak test.  Proximal to the anastomosis, the colon was gently occluded.  The pelvis was flooded with sterile saline.  The flexible sigmoidoscope was then passed from the anus up to the level of the anastomosis.  The leak test demonstrated no evidence of air bubbles.  The anastomosis was hemostatic, airtight, and on reinspection, clearly tension-free.  Irrigation was evacuated and hemostasis verified.  The abdomen was surveyed and there is no evidence of injury.  Omentum was placed back in the pelvis.  Attention was turned to abdominal closure.  All equipment was passed off and gowns/gloves changed.  Additional sterile drapes were placed around the field.  Using clean equipment, the fascia at the colostomy site was closed using #1 nonlooped PDS suture in a transverse manner.  The fascial closure was palpated and noted to be complete.  Skin incisions were closed using 4-0 Monocryl subcuticular suture.  At the colostomy site, a pursestring 2-0 Vicryl suture was placed to cinch the skin down.  The wound was irrigated.  A moist 4 x 4 was loosely packed into the closure site.  A dressing consisting of 4 x 4, ABD, and tape was placed over the site.  Dermabond had been placed over the skin incisions.  The ureteral stents were then removed and intact.  He was then taken out of lithotomy, awakened from anesthesia, extubated, and transferred to a  stretcher for transport to PACU in satisfactory condition

## 2020-03-06 NOTE — H&P (Signed)
CC: Follow-up - hx of exlap/Hartmann's for perforated diverticulitis  HPI: Kyle Moss is a very pleasant 57yoM whom presented to ED with significant diverticulitis 03/05/2019. He had a large extraluminal collection showed gas and stool consistent with a perforation of his colon and additionally free air tracking up to umbilicus. He was taken emergently to the operating room that evening by my partner, Dr. Brantley Stage for exploratory laparotomy with resection of distal descending colon and drainage of a large intra-abdominal abscess and colostomy. Difficult case due to amount of fibrosis related to this perforation. Based on the appearance of the colon intraoperatively, there were concerns for possible Crohn's disease. The remainder of his GI tract did however appear normal. He was in the hospital for 8 days following this where he slowly recovered. He was discharged 03/13/2019. He has done reasonably well since. He has returned to work. He has been getting along just fine of his colostomy. He denies any complaints aside from having a colostomy. He has been eating well and learning the kinds of foods that can be problematic when having a colostomy.  Pathology from his surgery returned 15 Senior; with diverticulosis and diverticulitis with associated perforation and serositis. Hyperplastic polyp. Benign lymph nodes.  He underwent Gastrografin enema 05/24/2019 which demonstrated a normal-appearing Hartman's pouch. He does have a fair amount of remaining sigmoid on this evaluation. He underwent colonoscopy 07/06/2019 which demonstrated a relatively normal-appearing colon proximal to the stoma. There was no evidence of IBD seen.  He was followed-up in the office where he was noted to be doing well.He reports he has been getting along fine with his colostomy but is still interested in pursuing colostomy reversal surgery. He is well today - tolerated prep reasonably well - effluent thin/clear/green.  Denies any changes in his health or health history since we met. No abdominal pain.  PMH: Denies  PSH: Exlap/Hartmann's 02/2019  FHx: Denies FHx of malignancy  Social: Denies use of tobacco/drugs; EtOH use ~once per week. 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  ROS: A comprehensive 10 system review of systems was completed with the patient and pertinent findings as noted above.  Past Medical History:  Diagnosis Date  . Aspiration pneumonia ([place])    childhood  . Colon perforation ([place]) 02/2019  . Diverticulitis   . History of COVID-19 09/2019  . Migraines     Past Surgical History:  Procedure Laterality Date  . COLON SURGERY    . COLONOSCOPY  07/06/2019  . LAPAROTOMY N/A 03/05/2019   Procedure: EXPLORATORY LAPAROTOMY;  Surgeon: Erroll Luna, MD;  Location: [facility];  Service: General;  Laterality: N/A;  . LUNG SURGERY     remove peanut aspiration  . WISDOM TOOTH EXTRACTION      Family History  Problem Relation Age of Onset  . Diabetes Maternal Grandmother     Social:  reports that he has never smoked. He has never used smokeless tobacco. He reports current alcohol use. He reports that he does not use drugs.  Allergies: No Known Allergies  Medications: I have reviewed the patient's current medications.  No results found for this or any previous visit (from the past 48 hour(s)).  No results found.  ROS - all of the below systems have been reviewed with the patient and positives are indicated with bold text General: chills, fever or night sweats Eyes: blurry vision or double vision ENT: epistaxis or sore throat Allergy/Immunology: itchy/watery eyes or nasal congestion Hematologic/Lymphatic: bleeding problems, blood clots  or swollen lymph nodes Endocrine: temperature intolerance or unexpected weight changes Breast: new or changing breast lumps or nipple discharge Resp: cough, shortness of breath, or wheezing CV: chest pain or dyspnea  on exertion GI: as per HPI GU: dysuria, trouble voiding, or hematuria MSK: joint pain or joint stiffness Neuro: TIA or stroke symptoms Derm: pruritus and skin lesion changes Psych: anxiety and depression  PE Height 5' 10"  (1.778 m), weight 83.1 kg. Constitutional: NAD; conversant Eyes: Moist conjunctiva; no lid lag; pupils equal/round Lungs: Normal respiratory effort CV: RRR; no pitting edema GI: Abd soft, nontender, nondistended; no palpable hepatosplenomegaly; stoma pink with thin clear green fluid in appliance. Psychiatric: Appropriate affect; alert and oriented x3  No results found for this or any previous visit (from the past 48 hour(s)).  No results found.   A/P: Kyle Moss is a very pleasant 74yoM with hx of perforated diverticulitis s/p Hartmann's procedure 02/2019 by Dr. Brantley Stage - here today for colostomy reversal  -GGE showed remnant sigmoid but otherwise clear -Colonoscopy 07/06/2019 - no concerns for IBD; diversion proctopathy as expected  -We re-reviewed the plan for today - laparoscopic, possible open, Hartmann's reversal, flexible sigmoidoscopy, cystoscopy/stents by urology  -The anatomy and physiology of the GI tract was discussed at length with him. We reviewed his current anatomy and he has requested colostomy reversal surgery. We discussed the technical aspects of this at length including major morbidity associated with the surgery of up to 20% in the literature. -The planned procedures, material risks (including, but not limited to, pain, bleeding, infection, scarring, need for blood transfusion, damage to surrounding structures- blood vessels/nerves/viscus/organs, damage to ureter, urine leak, leak from anastomosis, need for additional procedures, worsening of pre-existing medical conditions, need for ileostomy or another colostomy which may be permanent, scenarios were procedural abortion may occur, hernias, recurrent diverticulitis, DVT/PE, pneumonia, heart  attack, stroke, death) benefits and alternatives to surgery were discussed at length. The patient's questions were answered to his satisfaction, he voiced understanding and has elected to proceed with surgery. Additionally, we discussed typical postoperative expectations and the recovery process including wound care expectations.  Sharon Mt. Dema Severin, M.D. [facility]

## 2020-03-06 NOTE — Transfer of Care (Signed)
Immediate Anesthesia Transfer of Care Note  Patient: Kyle Moss  Procedure(s) Performed: LAPAROSCOPIC  COLOSTOMY TAKEDOWN, SIGMOIDECTOMY, BILATERAL TAP BLOCK (N/A Abdomen) FLEXIBLE SIGMOIDOSCOPY (N/A ) CYSTOSCOPY WITH BILATERAL RETROGRADE PYELOGRAM AND BILATERAL OPEN-ENDED URETERAL  STENT PLACEMENT CATHETER PLACEMENT (Bilateral )  Patient Location: PACU  Anesthesia Type:General  Level of Consciousness: awake and alert   Airway & Oxygen Therapy: Patient Spontanous Breathing and Patient connected to face mask oxygen  Post-op Assessment: Report given to RN and Post -op Vital signs reviewed and stable  Post vital signs: Reviewed and stable  Last Vitals:  Vitals Value Taken Time  BP    Temp    Pulse 120 03/06/20 1232  Resp 13 03/06/20 1232  SpO2 100 % 03/06/20 1232  Vitals shown include unvalidated device data.  Last Pain:  Vitals:   03/06/20 0652  TempSrc:   PainSc: 0-No pain         Complications: No apparent anesthesia complications

## 2020-03-07 ENCOUNTER — Encounter: Payer: Self-pay | Admitting: *Deleted

## 2020-03-07 LAB — CBC
HCT: 46 % (ref 39.0–52.0)
Hemoglobin: 15.4 g/dL (ref 13.0–17.0)
MCH: 31.4 pg (ref 26.0–34.0)
MCHC: 33.5 g/dL (ref 30.0–36.0)
MCV: 93.7 fL (ref 80.0–100.0)
Platelets: 259 10*3/uL (ref 150–400)
RBC: 4.91 MIL/uL (ref 4.22–5.81)
RDW: 12.2 % (ref 11.5–15.5)
WBC: 11.2 10*3/uL — ABNORMAL HIGH (ref 4.0–10.5)
nRBC: 0 % (ref 0.0–0.2)

## 2020-03-07 LAB — BASIC METABOLIC PANEL
Anion gap: 11 (ref 5–15)
BUN: 11 mg/dL (ref 6–20)
CO2: 24 mmol/L (ref 22–32)
Calcium: 8.6 mg/dL — ABNORMAL LOW (ref 8.9–10.3)
Chloride: 104 mmol/L (ref 98–111)
Creatinine, Ser: 0.95 mg/dL (ref 0.61–1.24)
GFR calc Af Amer: >60 mL/min (ref >60)
GFR calc non Af Amer: >60 mL/min (ref >60)
Glucose, Bld: 128 mg/dL — ABNORMAL HIGH (ref 70–99)
Potassium: 4.1 mmol/L (ref 3.5–5.1)
Sodium: 139 mmol/L (ref 135–145)

## 2020-03-07 LAB — MAGNESIUM: Magnesium: 2.4 mg/dL (ref 1.7–2.4)

## 2020-03-07 LAB — PHOSPHORUS: Phosphorus: 4.1 mg/dL (ref 2.5–4.6)

## 2020-03-07 LAB — SURGICAL PATHOLOGY

## 2020-03-07 MED ORDER — TRAMADOL HCL 50 MG PO TABS: 50.0000 mg | ORAL_TABLET | Freq: Four times a day (QID) | ORAL | 0 refills | Status: AC | PRN

## 2020-03-07 NOTE — Discharge Instructions (Addendum)
POST OP INSTRUCTIONS AFTER COLON SURGERY  1. DIET: Be sure to include lots of fluids daily to stay hydrated - 64oz of water per day (8, 8 oz glasses).  Avoid fast food or heavy meals for the first couple of weeks as your are more likely to get nauseated. Avoid raw/uncooked fruits or vegetables for the first 4 weeks (its ok to have these if they are blended into smoothie form). If you have fruits/vegetables, make sure they are cooked until soft enough to mash on the roof of your mouth and chew your food well. Otherwise, diet as tolerated.  2. Take your usually prescribed home medications unless otherwise directed.  3. PAIN CONTROL: a. Pain is best controlled by a usual combination of three different methods TOGETHER: i. Ice/Heat ii. Over the counter pain medication iii. Prescription pain medication b. Most patients will experience some swelling and bruising around the surgical site.  Ice packs or heating pads (30-60 minutes up to 6 times a day) will help. Some people prefer to use ice alone, heat alone, alternating between ice & heat.  Experiment to what works for you.  Swelling and bruising can take several weeks to resolve.   c. It is helpful to take an over-the-counter pain medication regularly for the first few weeks: i. Ibuprofen (Motrin/Advil) - 200mg  tabs - take 3 tabs (600mg ) every 6 hours as needed for pain (unless you have been directed previously to avoid NSAIDs/ibuprofen) ii. Acetaminophen (Tylenol) - you may take 650mg  every 6 hours as needed. You can take this with motrin as they act differently on the body. If you are taking a narcotic pain medication that has acetaminophen in it, do not take over the counter tylenol at the same time. iii. NOTE: You may take both of these medications together - most patients  find it most helpful when alternating between the two (i.e. Ibuprofen at 6am, tylenol at 9am, ibuprofen at 12pm ...) d. A  prescription for pain medication should be given to you  upon discharge.  Take your pain medication as prescribed if your pain is not adequatly controlled with the over-the-counter pain reliefs mentioned above.  4. Avoid getting constipated.  Between the surgery and the pain medications, it is common to experience some constipation.  Increasing fluid intake and taking a fiber supplement (such as Metamucil, Citrucel, FiberCon, MiraLax, etc) 1-2 times a day regularly will usually help prevent this problem from occurring.  A mild laxative (prune juice, Milk of Magnesia, MiraLax, etc) should be taken according to package directions if there are no bowel movements after 48 hours.    5. Dressing: Your incisions are covered in Dermabond which is like sterile superglue for the skin. This will come off on it's own in a couple weeks. It is waterproof and you may bathe normally starting the day after your surgery in a shower. Avoid baths/pools/lakes/oceans until your wounds have fully healed. Where your colostomy was located, you may continue to "wick" the superficial portion of the wound with a moist gauze and change daily until it closes to expedite the healing process. You may bathe normally getting your colostomy closure site wet with soap/water over it in the shower.  6. ACTIVITIES as tolerated:   a. Avoid heavy lifting (>10lbs or 1 gallon of milk) for the next 6 weeks. b. You may resume regular daily activities as tolerated--such as daily self-care, walking, climbing stairs--gradually increasing activities as tolerated.  If you can walk 30 minutes without difficulty, it is safe  to try more intense activity such as jogging, treadmill, bicycling, low-impact aerobics.  c. DO NOT PUSH THROUGH PAIN.  Let pain be your guide: If it hurts to do something, don't do it. d. Bonita Quin may drive when you are no longer taking prescription pain medication, you can comfortably wear a seatbelt, and you can safely maneuver your car and apply brakes.  7. FOLLOW UP in our office a. Please  call [facility] at [phone] to set up an appointment to see your surgeon in the office for a follow-up appointment approximately 2 weeks after your surgery. b. Make sure that you call for this appointment the day you arrive home to insure a convenient appointment time.  9. If you have disability or family leave forms that need to be completed, you may have them completed by your primary care physician's office; for return to work instructions, please ask our office staff and they will be happy to assist you in obtaining this documentation   When to call us [phone]: 1. Poor pain control 2. Reactions / problems with new medications (rash/itching, etc)  3. Fever over 101.5 F (38.5 C) 4. Inability to urinate 5. Nausea/vomiting 6. Worsening swelling or bruising 7. Continued bleeding from incision. 8. Increased pain, redness, or drainage from the incision  The clinic staff is available to answer your questions during regular business hours (8:30am-5pm).  Please don't hesitate to call and ask to speak to one of our nurses for clinical concerns.   A surgeon from [facility] is always on call at the hospitals   If you have a medical emergency, go to the nearest emergency room or call 911.  [facility] [address], [place], [place]  [address] MAIN: [id number] FAX: [phone] [email or web address]

## 2020-03-07 NOTE — Evaluation (Signed)
Occupational Therapy Evaluation Patient Details Name: Kyle Moss MRN: [id number] DOB: 1986-02-24 Today's Date: 03/07/2020    History of Present Illness -year-old male with Hartman's pouch here for reversal.  He originally had a pouch placed for perforated diverticulitis.     Clinical Impression   Patient at baseline, independent with self care, mobility and transfers. Addressed all patient/family's questions/concerns. Will sign off acute OT.    Follow Up Recommendations  No OT follow up    Equipment Recommendations  None recommended by OT       Precautions / Restrictions Precautions Precautions: None Precaution Comments: log roll  Restrictions Weight Bearing Restrictions: No      Mobility Bed Mobility Overal bed mobility: Modified Independent             General bed mobility comments: instruct patient in log roll technique to minimize abdominal strain, pt return demo with adequate technique   Transfers Overall transfer level: Independent Equipment used: None                  Balance Overall balance assessment: Independent                                         ADL either performed or assessed with clinical judgement   ADL Overall ADL's : At baseline;Independent                                             Vision Baseline Vision/History: Wears glasses Wears Glasses: At all times              Pertinent Vitals/Pain Pain Assessment: 0-10 Pain Score: 1  Pain Location: abdomen Pain Descriptors / Indicators: Discomfort Pain Intervention(s): Monitored during session     Hand Dominance Right   Extremity/Trunk Assessment Upper Extremity Assessment Upper Extremity Assessment: Overall WFL for tasks assessed   Lower Extremity Assessment Lower Extremity Assessment: Defer to PT evaluation   Cervical / Trunk Assessment Cervical / Trunk Assessment: Normal   Communication Communication Communication: No  difficulties   Cognition Arousal/Alertness: Awake/alert Behavior During Therapy: WFL for tasks assessed/performed Overall Cognitive Status: Within Functional Limits for tasks assessed                                     General Comments  educate patient on proper body mechanics to minimize strain on abdomen while healing and compensatory strategies for self care as needed. patient verbalize understanding            Home Living Family/patient expects to be discharged to:: Private residence Living Arrangements: Parent Available Help at Discharge: Family Type of Home: House       Home Layout: Two level;Bed/bath upstairs     Bathroom Shower/Tub: Tub/shower unit;Walk-in shower   Bathroom Toilet: Standard     Home Equipment: None          Prior Functioning/Environment Level of Independence: Independent                 OT Problem List: Pain       AM-PAC OT "6 Clicks" Daily Activity     Outcome Measure Help from another person eating meals?: None Help from another person taking care  of personal grooming?: None Help from another person toileting, which includes using toliet, bedpan, or urinal?: None Help from another person bathing (including washing, rinsing, drying)?: None Help from another person to put on and taking off regular upper body clothing?: None Help from another person to put on and taking off regular lower body clothing?: None 6 Click Score: 24   End of Session  Activity Tolerance: Patient tolerated treatment well Patient left: in bed;with call bell/phone within reach;with family/visitor present  OT Visit Diagnosis: Pain Pain - part of body: (abdomen)                Time: 1610-9604 OT Time Calculation (min): 15 min Charges:  OT General Charges $OT Visit: 1 Visit OT Evaluation $OT Eval Low Complexity: 1 Low  Delbert Phenix OT Pager: [place] 03/07/2020, 1:21 PM

## 2020-03-07 NOTE — Anesthesia Postprocedure Evaluation (Signed)
Anesthesia Post Note  Patient: Stage manager  Procedure(s) Performed: LAPAROSCOPIC  COLOSTOMY TAKEDOWN, SIGMOIDECTOMY, BILATERAL TAP BLOCK (N/A Abdomen) FLEXIBLE SIGMOIDOSCOPY (N/A ) CYSTOSCOPY WITH BILATERAL RETROGRADE PYELOGRAM AND BILATERAL OPEN-ENDED URETERAL  STENT PLACEMENT CATHETER PLACEMENT (Bilateral )     Patient location during evaluation: PACU Anesthesia Type: General Level of consciousness: awake and alert Pain management: pain level controlled Vital Signs Assessment: post-procedure vital signs reviewed and stable Respiratory status: spontaneous breathing, nonlabored ventilation and respiratory function stable Cardiovascular status: blood pressure returned to baseline, stable and tachycardic Postop Assessment: no apparent nausea or vomiting Anesthetic complications: no    Last Vitals:  Vitals:   03/07/20 0521 03/07/20 0901  BP: 134/86 129/84  Pulse: 96 84  Resp: 18 17  Temp: 36.9 C 36.9 C  SpO2: 96% 97%    Last Pain:  Vitals:   03/07/20 0901  TempSrc: Oral  PainSc:                  Kyle Moss

## 2020-03-07 NOTE — Plan of Care (Signed)
  Problem: Education: Goal: Knowledge of General Education information will improve Description: Including pain rating scale, medication(s)/side effects and non-pharmacologic comfort measures Outcome: Progressing   Problem: Health Behavior/Discharge Planning: Goal: Ability to manage health-related needs will improve Outcome: Progressing   Problem: Clinical Measurements: Goal: Ability to maintain clinical measurements within normal limits will improve Outcome: Progressing   Problem: Activity: Goal: Risk for activity intolerance will decrease Outcome: Progressing   Problem: Nutrition: Goal: Adequate nutrition will be maintained Outcome: Progressing   Problem: Elimination: Goal: Will not experience complications related to urinary retention Outcome: Progressing   Problem: Pain Managment: Goal: General experience of comfort will improve Outcome: Progressing   Problem: Safety: Goal: Ability to remain free from injury will improve Outcome: Progressing   

## 2020-03-07 NOTE — Progress Notes (Signed)
PT Cancellation Note  Patient Details Name: Kyle Moss MRN: [id number] DOB: 01-30-1986   Cancelled Treatment:     PT order received but eval deferred - RN advises pt is up ambulating in halls IND and with no PT needs.  PT service will sign off at this time.  Mauro Kaufmann PT Acute Rehabilitation Services Pager [phone] Office [phone]    [facility] 03/07/2020, 1:58 PM

## 2020-03-07 NOTE — Progress Notes (Signed)
Subjective No acute events. Feeling well. Denies nausea or vomiting. Reports he has been passing flatus. Pain well controlled. Up and around last night. Has been taking clear liquids.  Objective: Vital signs in last 24 hours: Temp:  [97.6 F (36.4 C)-99.4 F (37.4 C)] 98.4 F (36.9 C) (04/22 0521) Pulse Rate:  [93-126] 96 (04/22 0521) Resp:  [12-19] 18 (04/22 0521) BP: (131-163)/(80-112) 134/86 (04/22 0521) SpO2:  [96 %-100 %] 96 % (04/22 0521) Weight:  [84 kg] 84 kg (04/22 0524) Last BM Date: 03/05/20  Intake/Output from previous day: 04/21 0701 - 04/22 0700 In: 3090 [P.O.:240; I.V.:2250; IV Piggyback:600] Out: 3025 [Urine:2900; Blood:125] Intake/Output this shift: No intake/output data recorded.  Gen: NAD, comfortable CV: RRR Pulm: Normal work of breathing Abd: Soft, nontender, not significantly distended. Incisions c/d/i without erythema; ostomy wick in place - changed Ext: SCDs in place  Lab Results: CBC  Recent Labs    03/06/20 1454 03/07/20 0407  WBC 16.1* 11.2*  HGB 16.4 15.4  HCT 47.9 46.0  PLT 272 259   BMET Recent Labs    03/06/20 1454 03/07/20 0407  NA  --  139  K  --  4.1  CL  --  104  CO2  --  24  GLUCOSE  --  128*  BUN  --  11  CREATININE 1.20 0.95  CALCIUM  --  8.6*   PT/INR No results for input(s): LABPROT, INR in the last 72 hours. ABG No results for input(s): PHART, HCO3 in the last 72 hours.  Invalid input(s): PCO2, PO2  Studies/Results:  Anti-infectives: Anti-infectives (From admission, onward)   Start     Dose/Rate Route Frequency Ordered Stop   03/06/20 1400  neomycin (MYCIFRADIN) tablet 1,000 mg  Status:  Discontinued     1,000 mg Oral 3 times per day 03/06/20 0701 03/06/20 0702   03/06/20 1400  metroNIDAZOLE (FLAGYL) tablet 1,000 mg  Status:  Discontinued     1,000 mg Oral 3 times per day 03/06/20 0701 03/06/20 0702   03/06/20 0715  cefoTEtan (CEFOTAN) 2 g in sodium chloride 0.9 % 100 mL IVPB     2 g 200 mL/hr over 30  Minutes Intravenous On call to O.R. 03/06/20 0701 03/06/20 0848       Assessment/Plan: Patient Active Problem List   Diagnosis Date Noted  . S/P colostomy takedown 03/06/2020  . Colon perforation (HCC) 03/05/2019   s/p Procedure(s): LAPAROSCOPIC  COLOSTOMY TAKEDOWN, SIGMOIDECTOMY, BILATERAL TAP BLOCK FLEXIBLE SIGMOIDOSCOPY CYSTOSCOPY WITH BILATERAL RETROGRADE PYELOGRAM AND BILATERAL OPEN-ENDED URETERAL  STENT PLACEMENT CATHETER PLACEMENT 03/06/2020  -Advance to full liquids; then soft as tolerated -Will d/c foley -Ambulate 5x/day -Wick single 4x4 to stoma site -PPx: SQH, SCDs   LOS: 1 day   Stephanie Coup. Cliffton Asters, M.D. [facility] Use [email or web address] to contact on call provider

## 2020-03-07 NOTE — Op Note (Signed)
Operative Note  Preoperative diagnosis:  1.  History of diverticulitis status post Hartmann pouch  Post operative diagnosis: 1.  Same  Procedure(s): 1.  Cystoscopy with bilateral retrograde pyelogram and bilateral open-ended ureteral catheter placement  Surgeon: Modena Slater, MD  Assistants: None  Anesthesia: General  Complications: None immediate  EBL: Minimal  Specimens: 1.  None  Drains/Catheters: 1.  Bilateral open-ended ureteral catheters 2.  Foley catheter  Intraoperative findings: 1.  Normal urethra and bladder 2.  Bilateral retrograde pyelogram revealed normal ureter and kidney without any filling defects  Indication: 34 year old male with a history of perforated diverticulitis status post Gertie Gowda pouch presents for colostomy reversal.  Intraoperative stent placement was requested.  Description of procedure:  The patient was identified and consent was obtained.  The patient was taken to the operating room and placed in the supine position.  The patient was placed under general anesthesia.  Perioperative antibiotics were administered.  The patient was placed in dorsal lithotomy.  Patient was prepped and draped in a standard sterile fashion and a timeout was performed.  A 21 French rigid cystoscope was advanced into the urethra and into the bladder.  The left distal most portion of the ureter was cannulated with an open-ended ureteral catheter.  Retrograde pyelogram was performed with the findings noted above.  A sensor wire was then advanced up to the kidney under fluoroscopic guidance.  The open-ended ureteral catheter was advanced over the wire up to the kidney and then the scope and wire were withdrawn keeping the open-ended ureteral catheter in place.   The right distal most portion of the ureter was cannulated with an open-ended ureteral catheter.  Retrograde pyelogram was performed with the findings noted above.  A sensor wire was then advanced up to  the kidney under fluoroscopic guidance.  The open-ended ureteral catheter was advanced over the wire up to the kidney and then the scope and wire were withdrawn keeping the open-ended ureteral catheter in place.Foley catheter was placed and both open-ended ureteral catheters were secured and threaded through into a drainage bag.  This concluded my portion of the operation.  Patient tolerated procedure well and the case was handed over to general surgery.  Plan: Per general surgery.  Urology will be available as needed.

## 2020-03-08 LAB — BASIC METABOLIC PANEL
Anion gap: 7 (ref 5–15)
BUN: 12 mg/dL (ref 6–20)
CO2: 28 mmol/L (ref 22–32)
Calcium: 8.5 mg/dL — ABNORMAL LOW (ref 8.9–10.3)
Chloride: 105 mmol/L (ref 98–111)
Creatinine, Ser: 0.91 mg/dL (ref 0.61–1.24)
GFR calc Af Amer: >60 mL/min (ref >60)
GFR calc non Af Amer: >60 mL/min (ref >60)
Glucose, Bld: 130 mg/dL — ABNORMAL HIGH (ref 70–99)
Potassium: 4.2 mmol/L (ref 3.5–5.1)
Sodium: 140 mmol/L (ref 135–145)

## 2020-03-08 MED ORDER — ACETAMINOPHEN 500 MG PO TABS: 1000.0000 mg | ORAL_TABLET | Freq: Four times a day (QID) | ORAL | 0 refills | Status: AC | PRN

## 2020-03-08 MED ORDER — IBUPROFEN 600 MG PO TABS: 600.0000 mg | ORAL_TABLET | Freq: Four times a day (QID) | ORAL | 0 refills | Status: AC | PRN

## 2020-03-08 NOTE — TOC Transition Note (Signed)
Transition of Care [facility]) - CM/SW Discharge Note   Patient Details  Name: Kyle Moss MRN: [id number] Date of Birth: Aug 16, 1986  Transition of Care [facility]) CM/SW Contact:  Golda Acre, RN Phone Number: 03/08/2020, 9:06 AM   Clinical Narrative:    Home with self care, has supplies needed for dsg changes.  Advancing diet to regular.   Final next level of care: Home/Self Care Barriers to Discharge: No Barriers Identified   Patient Goals and CMS Choice Patient states their goals for this hospitalization and ongoing recovery are:: just to go home and get well CMS Medicare.gov Compare Post Acute Care list provided to:: Patient    Discharge Placement                       Discharge Plan and Services                                     Social Determinants of Health (SDOH) Interventions     Readmission Risk Interventions No flowsheet data found.

## 2020-03-08 NOTE — Progress Notes (Signed)
Discharge instructions discussed with patient and family, including dressing change, verbalized agreement and understanding

## 2020-03-11 NOTE — Discharge Summary (Signed)
Patient ID: Kyle Moss MRN: [id number] DOB/AGE: Jun 16, 1986 34 y.o.  Admit date: 03/06/2020 Discharge date: 03/11/2020  Discharge Diagnoses Patient Active Problem List   Diagnosis Date Noted  . S/P colostomy takedown 03/06/2020  . Colon perforation (HCC) 03/05/2019    Consultants None  Procedures OR 03/06/20:  1. Laparoscopic colostomy takedown 2. Laparoscopic sigmoidectomy 3. Laparoscopic lysis of adhesions x 45 minutes 4. Flexible sigmoidoscopy 5. Bilateral transversus abdominus plane blocks  Hospital Course: He was admitted postoperatively where he recovered well. His diet was advanced, he was ambulating, pain well controlled on oral analgesics and he was passing flatus and having BMs. On POD#2, he was deemed stable for discharge home. Pathology returned with expected benign findings    Allergies as of 03/08/2020   No Known Allergies     Medication List    STOP taking these medications   metroNIDAZOLE 500 MG tablet Commonly known as: FLAGYL   neomycin 500 MG tablet Commonly known as: MYCIFRADIN     TAKE these medications   acetaminophen 500 MG tablet Commonly known as: TYLENOL Take 2 tablets (1,000 mg total) by mouth every 6 (six) hours as needed.   ibuprofen 600 MG tablet Commonly known as: ADVIL Take 1 tablet (600 mg total) by mouth every 6 (six) hours as needed (pain not controlled with tylenol).   traMADol 50 MG tablet Commonly known as: ULTRAM Take 1 tablet (50 mg total) by mouth every 6 (six) hours as needed for up to 5 days (severe postop pain not controlled with tylenol and ibuprofen).        Follow-up Information    Andria Meuse, MD Follow up in 2 week(s).   Specialty: General Surgery Contact information: [address] [place] [address] [phone]           Stephanie Coup. Cliffton Asters, M.D. [facility]
